# Patient Record
Sex: Female | Born: 1970 | Race: White | Hispanic: No | Marital: Married | Smoking: Never smoker
Health system: Southern US, Community
[De-identification: ages and names within clinical notes are randomized; demographics above are authoritative.]

## PROBLEM LIST (undated history)

## (undated) DIAGNOSIS — Z87442 Personal history of urinary calculi: Secondary | ICD-10-CM

---

## 2013-08-23 ENCOUNTER — Ambulatory Visit (HOSPITAL_COMMUNITY)
Admission: RE | Admit: 2013-08-23 | Discharge: 2013-08-23 | Disposition: A | Discharge status: Home / Self Care | Payer: BC Managed Care – PPO | Source: Ambulatory Visit | Attending: Urology | Admitting: Urology

## 2013-08-23 ENCOUNTER — Other Ambulatory Visit: Payer: Self-pay | Admitting: Urology

## 2013-08-23 ENCOUNTER — Ambulatory Visit (INDEPENDENT_AMBULATORY_CARE_PROVIDER_SITE_OTHER): Payer: BC Managed Care – PPO | Admitting: Urology

## 2013-08-23 DIAGNOSIS — N200 Calculus of kidney: Secondary | ICD-10-CM

## 2013-08-23 DIAGNOSIS — N201 Calculus of ureter: Secondary | ICD-10-CM

## 2013-08-26 ENCOUNTER — Other Ambulatory Visit: Payer: Self-pay | Admitting: Urology

## 2013-08-26 DIAGNOSIS — N200 Calculus of kidney: Secondary | ICD-10-CM

## 2013-08-30 ENCOUNTER — Ambulatory Visit (HOSPITAL_COMMUNITY)
Admission: RE | Admit: 2013-08-30 | Discharge: 2013-08-30 | Disposition: A | Discharge status: Home / Self Care | Payer: BC Managed Care – PPO | Source: Ambulatory Visit | Attending: Urology | Admitting: Urology

## 2013-08-30 DIAGNOSIS — N200 Calculus of kidney: Secondary | ICD-10-CM | POA: Insufficient documentation

## 2013-10-25 ENCOUNTER — Ambulatory Visit (INDEPENDENT_AMBULATORY_CARE_PROVIDER_SITE_OTHER): Payer: BC Managed Care – PPO | Admitting: Urology

## 2013-10-25 ENCOUNTER — Ambulatory Visit (HOSPITAL_COMMUNITY)
Admission: RE | Admit: 2013-10-25 | Discharge: 2013-10-25 | Disposition: A | Discharge status: Home / Self Care | Payer: BC Managed Care – PPO | Source: Ambulatory Visit | Attending: Urology | Admitting: Urology

## 2013-10-25 ENCOUNTER — Other Ambulatory Visit: Payer: Self-pay | Admitting: Urology

## 2013-10-25 DIAGNOSIS — N200 Calculus of kidney: Secondary | ICD-10-CM

## 2017-03-03 ENCOUNTER — Ambulatory Visit: Payer: Self-pay | Admitting: Urology

## 2017-05-12 ENCOUNTER — Other Ambulatory Visit: Payer: Self-pay | Admitting: Urology

## 2017-05-12 ENCOUNTER — Ambulatory Visit (HOSPITAL_COMMUNITY)
Admission: RE | Admit: 2017-05-12 | Discharge: 2017-05-12 | Disposition: A | Discharge status: Home / Self Care | Payer: No Typology Code available for payment source | Source: Ambulatory Visit | Attending: Urology | Admitting: Urology

## 2017-05-12 ENCOUNTER — Ambulatory Visit (INDEPENDENT_AMBULATORY_CARE_PROVIDER_SITE_OTHER): Payer: PRIVATE HEALTH INSURANCE | Admitting: Urology

## 2017-05-12 DIAGNOSIS — N200 Calculus of kidney: Secondary | ICD-10-CM

## 2017-05-12 DIAGNOSIS — K8020 Calculus of gallbladder without cholecystitis without obstruction: Secondary | ICD-10-CM | POA: Diagnosis not present

## 2017-05-12 DIAGNOSIS — I700 Atherosclerosis of aorta: Secondary | ICD-10-CM | POA: Diagnosis not present

## 2017-05-12 DIAGNOSIS — R3121 Asymptomatic microscopic hematuria: Secondary | ICD-10-CM | POA: Diagnosis not present

## 2017-05-12 DIAGNOSIS — N3000 Acute cystitis without hematuria: Secondary | ICD-10-CM | POA: Diagnosis not present

## 2017-08-11 ENCOUNTER — Ambulatory Visit: Payer: No Typology Code available for payment source | Admitting: Urology

## 2018-04-20 ENCOUNTER — Ambulatory Visit (INDEPENDENT_AMBULATORY_CARE_PROVIDER_SITE_OTHER): Payer: No Typology Code available for payment source | Admitting: Urology

## 2018-04-20 ENCOUNTER — Other Ambulatory Visit (HOSPITAL_COMMUNITY): Payer: Self-pay | Admitting: Urology

## 2018-04-20 ENCOUNTER — Ambulatory Visit (HOSPITAL_COMMUNITY)
Admission: RE | Admit: 2018-04-20 | Discharge: 2018-04-20 | Disposition: A | Discharge status: Home / Self Care | Payer: No Typology Code available for payment source | Source: Ambulatory Visit | Attending: Urology | Admitting: Urology

## 2018-04-20 DIAGNOSIS — N200 Calculus of kidney: Secondary | ICD-10-CM

## 2020-02-29 ENCOUNTER — Ambulatory Visit (INDEPENDENT_AMBULATORY_CARE_PROVIDER_SITE_OTHER): Payer: No Typology Code available for payment source | Admitting: Urology

## 2020-02-29 ENCOUNTER — Ambulatory Visit (HOSPITAL_COMMUNITY)
Admission: RE | Admit: 2020-02-29 | Discharge: 2020-02-29 | Disposition: A | Discharge status: Home / Self Care | Payer: No Typology Code available for payment source | Source: Ambulatory Visit | Attending: Urology | Admitting: Urology

## 2020-02-29 ENCOUNTER — Other Ambulatory Visit: Payer: Self-pay

## 2020-02-29 ENCOUNTER — Encounter: Payer: Self-pay | Admitting: Urology

## 2020-02-29 ENCOUNTER — Ambulatory Visit: Payer: No Typology Code available for payment source | Admitting: Urology

## 2020-02-29 VITALS — BP 147/83 | HR 81 | Temp 98.3°F | Ht 66.0 in | Wt 165.0 lb

## 2020-02-29 DIAGNOSIS — R109 Unspecified abdominal pain: Secondary | ICD-10-CM

## 2020-02-29 DIAGNOSIS — N201 Calculus of ureter: Secondary | ICD-10-CM

## 2020-02-29 DIAGNOSIS — N200 Calculus of kidney: Secondary | ICD-10-CM

## 2020-02-29 LAB — MICROSCOPIC EXAMINATION
Epithelial Cells (non renal): NONE SEEN /hpf (ref 0–10)
Renal Epithel, UA: NONE SEEN /hpf

## 2020-02-29 LAB — URINALYSIS, ROUTINE W REFLEX MICROSCOPIC
Bilirubin, UA: NEGATIVE
Glucose, UA: NEGATIVE
Ketones, UA: NEGATIVE
Nitrite, UA: NEGATIVE
Protein,UA: NEGATIVE
Specific Gravity, UA: 1.02 (ref 1.005–1.030)
Urobilinogen, Ur: 0.2 mg/dL (ref 0.2–1.0)
pH, UA: 5.5 (ref 5.0–7.5)

## 2020-02-29 MED ORDER — ONDANSETRON 4 MG PO TBDP: 4.0000 mg | ORAL_TABLET | Freq: Three times a day (TID) | ORAL | 0 refills | Status: DC | PRN

## 2020-02-29 MED ORDER — TAMSULOSIN HCL 0.4 MG PO CAPS: 0.4000 mg | ORAL_CAPSULE | Freq: Every day | ORAL | 0 refills | Status: DC

## 2020-02-29 MED ORDER — OXYCODONE-ACETAMINOPHEN 5-325 MG PO TABS: 1.0000 | ORAL_TABLET | ORAL | 0 refills | Status: DC | PRN

## 2020-02-29 NOTE — H&P (View-Only) (Signed)
 02/29/2020 11:52 AM   Cheryl Jenkins 09/28/1970 [id number]  Referring provider: No referring provider defined for this encounter.  nephrolithiasis  HPI: Cheryl Jenkins is 49yo here for evaluation of left flank pain. She developed left flank pain 1 week ago. The pain is sharp, intermittent mild to moderate and nonradiating. She has had numerous stone events since age 16. No other associated symptoms. No exacerbating/alleviating events. CT shows an 8mm proximal ureteral calculus which can be seen on K[employer] scout film. She has had ESWL in the past for her calculi.    PMH: No past medical history on file.  Surgical History:   Home Medications:  Allergies as of 02/29/2020   Not on File     Medication List    as of February 29, 2020 11:52 AM   You have not been prescribed any medications.     Allergies: Not on File  Family History: No family history on file.  Social History:  has no history on file for tobacco use, alcohol use, and drug use.  ROS: All other review of systems were reviewed and are negative except what is noted above in HPI  Physical Exam: BP (!) 147/83   Pulse 81   Temp 98.3 F (36.8 C)   Ht 5' 6" (1.676 m)   Wt 165 lb (74.8 kg)   LMP 10/18/2013   BMI 26.63 kg/m   Constitutional:  Alert and oriented, No acute distress. HEENT: [place] AT, moist mucus membranes.  Trachea midline, no masses. Cardiovascular: No clubbing, cyanosis, or edema. Respiratory: Normal respiratory effort, no increased work of breathing. GI: Abdomen is soft, nontender, nondistended, no abdominal masses GU: No CVA tenderness.  Lymph: No cervical or inguinal lymphadenopathy. Skin: No rashes, bruises or suspicious lesions. Neurologic: Grossly intact, no focal deficits, moving all 4 extremities. Psychiatric: Normal mood and affect.  Laboratory Data: No results found for: WBC, HGB, HCT, MCV, PLT  No results found for: CREATININE  No results found for: PSA  No results found for:  TESTOSTERONE  No results found for: HGBA1C  Urinalysis No results found for: COLORURINE, APPEARANCEUR, LABSPEC, PHURINE, GLUCOSEU, HGBUR, BILIRUBINUR, KETONESUR, PROTEINUR, UROBILINOGEN, NITRITE, LEUKOCYTESUR  No results found for: LABMICR, WBCUA, RBCUA, LABEPIT, MUCUS, BACTERIA  Pertinent Imaging: CT stone: Images reviewed and discussed with the patient Results for orders placed during the hospital encounter of 04/20/18  DG Abd 1 View  Narrative CLINICAL DATA:  Bilateral back pain.  History of kidney stones.  EXAM: ABDOMEN - 1 VIEW  COMPARISON:  CT scan 05/12/2017  FINDINGS: Bilateral upper pole/midpole renal calculi are noted. No definite ureteral or bladder calculi. The soft tissue shadows of the abdomen are maintained. The bowel gas pattern is unremarkable. The bony structures are intact.  IMPRESSION: Bilateral renal calculi but no definite ureteral or bladder calculi.   Electronically Signed By: P.  Gallerani M.D. On: 04/20/2018 14:55  No results found for this or any previous visit.  No results found for this or any previous visit.  No results found for this or any previous visit.  No results found for this or any previous visit.  No results found for this or any previous visit.  No results found for this or any previous visit.  Results for orders placed during the hospital encounter of 05/12/17  CT RENAL STONE STUDY  Narrative CLINICAL DATA:  Left flank pain for 6 months with microhematuria. History of kidney stones.  EXAM: CT ABDOMEN AND PELVIS WITHOUT CONTRAST  TECHNIQUE: Multidetector CT imaging of   the abdomen and pelvis was performed following the standard protocol without IV contrast.  COMPARISON:  CT 08/30/2013.  FINDINGS: Lower chest: Clear lung bases. No significant pleural or pericardial effusion. Mild coronary artery atherosclerosis.  Hepatobiliary: The liver appears unremarkable as imaged in the noncontrast state. There is a  peripherally calcified 15 mm gallstone. The gallbladder is incompletely distended. No gallbladder wall thickening, surrounding inflammation or biliary dilatation.  Pancreas: Unremarkable. No pancreatic ductal dilatation or surrounding inflammatory changes.  Spleen: Normal in size without focal abnormality.  Adrenals/Urinary Tract: Both adrenal glands appear normal. There is a single 4 mm calculus in the interpolar region of the right kidney. There are 2 calculi in the interpolar region of the left kidney, largest measuring 8 mm on image 34/2. No evidence of ureteral or bladder calculus. There is no evidence of renal mass, hydronephrosis or perinephric soft tissue stranding. The bladder appears unremarkable.  Stomach/Bowel: No evidence of bowel wall thickening, distention or surrounding inflammatory change. The appendix appears normal. There is mild colonic diverticulosis.  Vascular/Lymphatic: There are no enlarged abdominal or pelvic lymph nodes. Mild aortoiliac atherosclerosis. No acute vascular findings on noncontrast imaging.  Reproductive: 2.9 cm left ovarian follicle. The uterus and right ovary appear unremarkable. No pelvic inflammatory changes.  Other: Small umbilical hernia containing only fat.  No ascites.  Musculoskeletal: No acute or significant osseous findings.  IMPRESSION: 1. Bilateral nephrolithiasis. No evidence hydronephrosis or ureteral calculus. 2. Cholelithiasis without evidence of cholecystitis or biliary dilatation. 3. Mild aortic atherosclerosis (ICD10-I70.0).   Electronically Signed By: Carey Bullocks M.D. On: 05/12/2017 13:02   Assessment & Plan:    1. Left ureteral calculus -We discussed the management of kidney stones. These options include observation, ureteroscopy, shockwave lithotripsy (ESWL) and percutaneous nephrolithotomy (PCNL). We discussed which options are relevant to the patient's stone(s). We discussed the natural history of  kidney stones as well as the complications of untreated stones and the impact on quality of life without treatment as well as with each of the above listed treatments. We also discussed the efficacy of each treatment in its ability to clear the stone burden. With any of these management options I discussed the signs and symptoms of infection and the need for emergent treatment should these be experienced. For each option we discussed the ability of each procedure to clear the patient of their stone burden.   For observation I described the risks which include but are not limited to silent renal damage, life-threatening infection, need for emergent surgery, failure to pass stone and pain.   For ureteroscopy I described the risks which include bleeding, infection, damage to contiguous structures, positioning injury, ureteral stricture, ureteral avulsion, ureteral injury, need for prolonged ureteral stent, inability to perform ureteroscopy, need for an interval procedure, inability to clear stone burden, stent discomfort/pain, heart attack, stroke, pulmonary embolus and the inherent risks with general anesthesia.   For shockwave lithotripsy I described the risks which include arrhythmia, kidney contusion, kidney hemorrhage, need for transfusion, pain, inability to adequately break up stone, inability to pass stone fragments, Steinstrasse, infection associated with obstructing stones, need for alternate surgical procedure, need for repeat shockwave lithotripsy, MI, CVA, PE and the inherent risks with anesthesia/conscious sedation.   For PCNL I described the risks including positioning injury, pneumothorax, hydrothorax, need for chest tube, inability to clear stone burden, renal laceration, arterial venous fistula or malformation, need for embolization of kidney, loss of kidney or renal function, need for repeat procedure, need for prolonged nephrostomy tube,  ureteral avulsion, MI, CVA, PE and the inherent risks  of general anesthesia.   - The patient would like to proceed with Left ESWL    No follow-ups on file.  Wilkie Aye, MD  [facility] [place]

## 2020-02-29 NOTE — Patient Instructions (Signed)

## 2020-02-29 NOTE — Progress Notes (Signed)
02/29/2020 11:52 AM   [name] [birth year] [id number]  Referring provider: No referring provider defined for this encounter.  nephrolithiasis  HPI: Ms Stigler is 50yo here for evaluation of left flank pain. She developed left flank pain 1 week ago. The pain is sharp, intermittent mild to moderate and nonradiating. She has had numerous stone events since age 50. No other associated symptoms. No exacerbating/alleviating events. CT shows an 32mm proximal ureteral calculus which can be seen on [employer] scout film. She has had ESWL in the past for her calculi.    PMH: No past medical history on file.  Surgical History:   Home Medications:  Allergies as of 02/29/2020   Not on File     Medication List    as of February 29, 2020 11:52 AM   You have not been prescribed any medications.     Allergies: Not on File  Family History: No family history on file.  Social History:  has no history on file for tobacco use, alcohol use, and drug use.  ROS: All other review of systems were reviewed and are negative except what is noted above in HPI  Physical Exam: BP (!) 147/83   Pulse 81   Temp 98.3 F (36.8 C)   Ht 5\' 6"  (1.676 m)   Wt 165 lb (74.8 kg)   LMP 10/18/2013   BMI 26.63 kg/m   Constitutional:  Alert and oriented, No acute distress. HEENT: [place] AT, moist mucus membranes.  Trachea midline, no masses. Cardiovascular: No clubbing, cyanosis, or edema. Respiratory: Normal respiratory effort, no increased work of breathing. GI: Abdomen is soft, nontender, nondistended, no abdominal masses GU: No CVA tenderness.  Lymph: No cervical or inguinal lymphadenopathy. Skin: No rashes, bruises or suspicious lesions. Neurologic: Grossly intact, no focal deficits, moving all 4 extremities. Psychiatric: Normal mood and affect.  Laboratory Data: No results found for: WBC, HGB, HCT, MCV, PLT  No results found for: CREATININE  No results found for: PSA  No results found for:  TESTOSTERONE  No results found for: HGBA1C  Urinalysis No results found for: COLORURINE, APPEARANCEUR, LABSPEC, PHURINE, GLUCOSEU, HGBUR, BILIRUBINUR, KETONESUR, PROTEINUR, UROBILINOGEN, NITRITE, LEUKOCYTESUR  No results found for: LABMICR, WBCUA, RBCUA, LABEPIT, MUCUS, BACTERIA  Pertinent Imaging: CT stone: Images reviewed and discussed with the patient Results for orders placed during the hospital encounter of 04/20/18  DG Abd 1 View  Narrative CLINICAL DATA:  Bilateral back pain.  History of kidney stones.  EXAM: ABDOMEN - 1 VIEW  COMPARISON:  CT scan 05/12/2017  FINDINGS: Bilateral upper pole/midpole renal calculi are noted. No definite ureteral or bladder calculi. The soft tissue shadows of the abdomen are maintained. The bowel gas pattern is unremarkable. The bony structures are intact.  IMPRESSION: Bilateral renal calculi but no definite ureteral or bladder calculi.   Electronically Signed By: 07/12/2017 M.D. On: 04/20/2018 14:55  No results found for this or any previous visit.  No results found for this or any previous visit.  No results found for this or any previous visit.  No results found for this or any previous visit.  No results found for this or any previous visit.  No results found for this or any previous visit.  Results for orders placed during the hospital encounter of 05/12/17  CT RENAL STONE STUDY  Narrative CLINICAL DATA:  Left flank pain for 6 months with microhematuria. History of kidney stones.  EXAM: CT ABDOMEN AND PELVIS WITHOUT CONTRAST  TECHNIQUE: Multidetector CT imaging of  the abdomen and pelvis was performed following the standard protocol without IV contrast.  COMPARISON:  CT 08/30/2013.  FINDINGS: Lower chest: Clear lung bases. No significant pleural or pericardial effusion. Mild coronary artery atherosclerosis.  Hepatobiliary: The liver appears unremarkable as imaged in the noncontrast state. There is a  peripherally calcified 15 mm gallstone. The gallbladder is incompletely distended. No gallbladder wall thickening, surrounding inflammation or biliary dilatation.  Pancreas: Unremarkable. No pancreatic ductal dilatation or surrounding inflammatory changes.  Spleen: Normal in size without focal abnormality.  Adrenals/Urinary Tract: Both adrenal glands appear normal. There is a single 4 mm calculus in the interpolar region of the right kidney. There are 2 calculi in the interpolar region of the left kidney, largest measuring 8 mm on image 34/2. No evidence of ureteral or bladder calculus. There is no evidence of renal mass, hydronephrosis or perinephric soft tissue stranding. The bladder appears unremarkable.  Stomach/Bowel: No evidence of bowel wall thickening, distention or surrounding inflammatory change. The appendix appears normal. There is mild colonic diverticulosis.  Vascular/Lymphatic: There are no enlarged abdominal or pelvic lymph nodes. Mild aortoiliac atherosclerosis. No acute vascular findings on noncontrast imaging.  Reproductive: 2.9 cm left ovarian follicle. The uterus and right ovary appear unremarkable. No pelvic inflammatory changes.  Other: Small umbilical hernia containing only fat.  No ascites.  Musculoskeletal: No acute or significant osseous findings.  IMPRESSION: 1. Bilateral nephrolithiasis. No evidence hydronephrosis or ureteral calculus. 2. Cholelithiasis without evidence of cholecystitis or biliary dilatation. 3. Mild aortic atherosclerosis (ICD10-I70.0).   Electronically Signed By: Carey Bullocks M.D. On: 05/12/2017 13:02   Assessment & Plan:    1. Left ureteral calculus -We discussed the management of kidney stones. These options include observation, ureteroscopy, shockwave lithotripsy (ESWL) and percutaneous nephrolithotomy (PCNL). We discussed which options are relevant to the patient's stone(s). We discussed the natural history of  kidney stones as well as the complications of untreated stones and the impact on quality of life without treatment as well as with each of the above listed treatments. We also discussed the efficacy of each treatment in its ability to clear the stone burden. With any of these management options I discussed the signs and symptoms of infection and the need for emergent treatment should these be experienced. For each option we discussed the ability of each procedure to clear the patient of their stone burden.   For observation I described the risks which include but are not limited to silent renal damage, life-threatening infection, need for emergent surgery, failure to pass stone and pain.   For ureteroscopy I described the risks which include bleeding, infection, damage to contiguous structures, positioning injury, ureteral stricture, ureteral avulsion, ureteral injury, need for prolonged ureteral stent, inability to perform ureteroscopy, need for an interval procedure, inability to clear stone burden, stent discomfort/pain, heart attack, stroke, pulmonary embolus and the inherent risks with general anesthesia.   For shockwave lithotripsy I described the risks which include arrhythmia, kidney contusion, kidney hemorrhage, need for transfusion, pain, inability to adequately break up stone, inability to pass stone fragments, Steinstrasse, infection associated with obstructing stones, need for alternate surgical procedure, need for repeat shockwave lithotripsy, MI, CVA, PE and the inherent risks with anesthesia/conscious sedation.   For PCNL I described the risks including positioning injury, pneumothorax, hydrothorax, need for chest tube, inability to clear stone burden, renal laceration, arterial venous fistula or malformation, need for embolization of kidney, loss of kidney or renal function, need for repeat procedure, need for prolonged nephrostomy tube,  ureteral avulsion, MI, CVA, PE and the inherent risks  of general anesthesia.   - The patient would like to proceed with Left ESWL    No follow-ups on file.  Wilkie Aye, MD  [facility] [place]

## 2020-02-29 NOTE — Progress Notes (Signed)
Urological Symptom Review  Patient is experiencing the following symptoms: Frequent urination Hard to postpone urination Get up at night to urinate Blood in urine  Kidney stone   Review of Systems  Gastrointestinal (upper)  : Nausea Vomiting  Gastrointestinal (lower) : Constipation  Constitutional : Fatigue  Skin: Negative for skin symptoms  Eyes: Negative for eye symptoms  Ear/Nose/Throat : Negative for Ear/Nose/Throat symptoms  Hematologic/Lymphatic: Negative for Hematologic/Lymphatic symptoms  Cardiovascular : Negative for cardiovascular symptoms  Respiratory : Negative for respiratory symptoms  Endocrine: Negative for endocrine symptoms  Musculoskeletal: Back pain  Neurological: Negative for neurological symptoms  Psychologic: Negative for psychiatric symptoms

## 2020-03-01 ENCOUNTER — Other Ambulatory Visit: Payer: Self-pay

## 2020-03-01 ENCOUNTER — Encounter (HOSPITAL_COMMUNITY)
Admission: RE | Admit: 2020-03-01 | Discharge: 2020-03-01 | Disposition: A | Discharge status: Home / Self Care | Payer: No Typology Code available for payment source | Source: Ambulatory Visit | Attending: Urology | Admitting: Urology

## 2020-03-01 ENCOUNTER — Encounter (HOSPITAL_COMMUNITY): Payer: Self-pay

## 2020-03-01 HISTORY — DX: Personal history of urinary calculi: Z87.442

## 2020-03-05 ENCOUNTER — Other Ambulatory Visit (HOSPITAL_COMMUNITY)
Admission: RE | Admit: 2020-03-05 | Discharge: 2020-03-05 | Disposition: A | Discharge status: Home / Self Care | Payer: No Typology Code available for payment source | Source: Ambulatory Visit | Attending: Urology | Admitting: Urology

## 2020-03-05 ENCOUNTER — Other Ambulatory Visit: Payer: Self-pay

## 2020-03-05 DIAGNOSIS — Z20822 Contact with and (suspected) exposure to covid-19: Secondary | ICD-10-CM | POA: Insufficient documentation

## 2020-03-05 DIAGNOSIS — Z01812 Encounter for preprocedural laboratory examination: Secondary | ICD-10-CM | POA: Insufficient documentation

## 2020-03-05 LAB — SARS CORONAVIRUS 2 (TAT 6-24 HRS): SARS Coronavirus 2: NEGATIVE

## 2020-03-06 ENCOUNTER — Ambulatory Visit (HOSPITAL_COMMUNITY)
Admission: RE | Admit: 2020-03-06 | Discharge: 2020-03-06 | Disposition: A | Discharge status: Home / Self Care | Payer: No Typology Code available for payment source | Attending: Urology | Admitting: Urology

## 2020-03-06 ENCOUNTER — Encounter (HOSPITAL_COMMUNITY)
Admission: RE | Disposition: A | Discharge status: Home / Self Care | Payer: Self-pay | Source: Home / Self Care | Attending: Urology

## 2020-03-06 ENCOUNTER — Ambulatory Visit (HOSPITAL_COMMUNITY)
Admission: RE | Admit: 2020-03-06 | Discharge: 2020-03-06 | Disposition: A | Discharge status: Home / Self Care | Payer: No Typology Code available for payment source | Source: Home / Self Care | Attending: Urology | Admitting: Urology

## 2020-03-06 DIAGNOSIS — N200 Calculus of kidney: Secondary | ICD-10-CM

## 2020-03-06 DIAGNOSIS — N202 Calculus of kidney with calculus of ureter: Secondary | ICD-10-CM | POA: Diagnosis not present

## 2020-03-06 DIAGNOSIS — N201 Calculus of ureter: Secondary | ICD-10-CM

## 2020-03-06 HISTORY — PX: EXTRACORPOREAL SHOCK WAVE LITHOTRIPSY: SHX1557

## 2020-03-06 SURGERY — LITHOTRIPSY, ESWL
Anesthesia: LOCAL | Laterality: Left

## 2020-03-06 MED ORDER — DIAZEPAM 5 MG PO TABS
10.0000 mg | ORAL_TABLET | Freq: Once | ORAL | Status: AC
  Administered 2020-03-06: 10 mg via ORAL
  Filled 2020-03-06: qty 2

## 2020-03-06 MED ORDER — TAMSULOSIN HCL 0.4 MG PO CAPS: 0.4000 mg | ORAL_CAPSULE | Freq: Every day | ORAL | 0 refills | Status: DC

## 2020-03-06 MED ORDER — ONDANSETRON 4 MG PO TBDP: 4.0000 mg | ORAL_TABLET | Freq: Three times a day (TID) | ORAL | 0 refills | Status: DC | PRN

## 2020-03-06 MED ORDER — DIPHENHYDRAMINE HCL 25 MG PO CAPS
25.0000 mg | ORAL_CAPSULE | ORAL | Status: AC
  Administered 2020-03-06: 25 mg via ORAL
  Filled 2020-03-06: qty 1

## 2020-03-06 MED ORDER — OXYCODONE-ACETAMINOPHEN 5-325 MG PO TABS: 1.0000 | ORAL_TABLET | ORAL | 0 refills | Status: DC | PRN

## 2020-03-06 MED ORDER — SODIUM CHLORIDE 0.9 % IV SOLN: Freq: Once | INTRAVENOUS | Status: AC

## 2020-03-06 NOTE — Interval H&P Note (Signed)
History and Physical Interval Note:  03/06/2020 10:51 AM  Cheryl Jenkins  has presented today for surgery, with the diagnosis of left ureteral calculus.  The various methods of treatment have been discussed with the patient and family. After consideration of risks, benefits and other options for treatment, the patient has consented to  Procedure(s): EXTRACORPOREAL SHOCK WAVE LITHOTRIPSY (ESWL) (Left) as a surgical intervention.  The patient's history has been reviewed, patient examined, no change in status, stable for surgery.  I have reviewed the patient's chart and labs.  Questions were answered to the patient's satisfaction.     Wilkie Aye

## 2020-03-06 NOTE — Discharge Instructions (Signed)
Lithotripsy, Care After This sheet gives you information about how to care for yourself after your procedure. Your health care provider may also give you more specific instructions. If you have problems or questions, contact your health care provider. What can I expect after the procedure? After the procedure, it is common to have:  Some blood in your urine. This should only last for a few days.  Soreness in your back, sides, or upper abdomen for a few days.  Blotches or bruises on the area where the shock wave entered the skin.  Pain, discomfort, or nausea when pieces (fragments) of the kidney stone move through the tube that carries urine from the kidney to the bladder (ureter). Stone fragments may pass soon after the procedure, but they may continue to pass for up to 4-8 weeks. ? If you have severe pain or nausea, contact your health care provider. This may be caused by a large stone that was not broken up, and this may mean that you need more treatment.  Some pain or discomfort during urination.  Some pain or discomfort in the lower abdomen or (in men) at the base of the penis. Follow these instructions at home: Medicines  Take over-the-counter and prescription medicines only as told by your health care provider.  If you were prescribed an antibiotic medicine, take it as told by your health care provider. Do not stop taking the antibiotic even if you start to feel better.  Ask your health care provider if the medicine prescribed to you requires you to avoid driving or using machinery. Eating and drinking  Drink enough fluid to keep your urine pale yellow. This helps any remaining pieces of the stone to pass. It can also help prevent new stones from forming.  Eat plenty of fresh fruits and vegetables.  Follow instructions from your health care provider about eating or drinking restrictions. You may be instructed to: ? Reduce how much salt (sodium) you eat or drink. Check  ingredients and nutrition facts on packaged foods and beverages to see how much sodium they contain. ? Reduce how much meat you eat.  Eat the recommended amount of calcium for your age and gender. Ask your health care provider how much calcium you should have.      General instructions  Get plenty of rest.  Return to your normal activities as told by your health care provider. Ask your health care provider what activities are safe for you. Most people can resume normal activities 1-2 days after the procedure.  If you were given a sedative during the procedure, it can affect you for several hours. Do not drive or operate machinery until your health care provider says that it is safe.  Your health care provider may direct you to lie in a certain position (postural drainage) and tap firmly (percuss) over your kidney area to help stone fragments pass. Follow instructions as told by your health care provider.  If directed, strain all urine through the strainer that was provided by your health care provider. ? Keep all fragments for your health care provider to see. Any stones that are found may be sent to a medical lab for examination. The stone may be as small as a grain of salt.  Keep all follow-up visits as told by your health care provider. This is important. Contact a health care provider if:  You have a fever or chills.  You have nausea that is severe or does not go away.  You have   any of these urinary symptoms: ? Blood in your urine for longer than your health care provider told you to expect. ? Urine that smells bad or unusual. ? Feeling a strong urge to urinate after emptying your bladder. ? Pain or burning with urination that does not go away. ? Urinating more often than usual and this does not go away.  You have a stent and it comes out. Get help right away if:  You have severe pain in your back, sides, or upper abdomen.  You have any of these urinary symptoms: ? Severe  pain while urinating. ? More blood in your urine or having blood in your urine when you did not before. ? Passing blood clots in your urine. ? Passing only a small amount of urine or being unable to pass any urine at all.  You have severe nausea that leads to persistent vomiting.  You faint. Summary  After this procedure, it is common to have some pain, discomfort, or nausea when pieces (fragments) of the kidney stone move through the tube that carries urine from the kidney to the bladder (ureter). If this pain or nausea is severe, however, you should contact your health care provider.  Return to your normal activities as told by your health care provider. Ask your health care provider what activities are safe for you.  Drink enough fluid to keep your urine pale yellow. This helps any remaining pieces of the stone to pass, and it can help prevent new stones from forming.  If directed, strain your urine and keep all fragments for your health care provider to see. Fragments or stones may be as small as a grain of salt.  Get help right away if you have severe pain in your back, sides, or upper abdomen, or if you have severe pain while urinating. This information is not intended to replace advice given to you by your health care provider. Make sure you discuss any questions you have with your health care provider. Document Revised: 11/10/2018 Document Reviewed: 11/10/2018 Elsevier Patient Education  2021 [employer]   PATIENT INSTRUCTIONS POST-ANESTHESIA  IMMEDIATELY FOLLOWING SURGERY:  Do not drive or operate machinery for the first twenty four hours after surgery.  Do not make any important decisions for twenty four hours after surgery or while taking narcotic pain medications or sedatives.  If you develop intractable nausea and vomiting or a severe headache please notify your doctor immediately.  FOLLOW-UP:  Please make an appointment with your surgeon as instructed. You do not need to  follow up with anesthesia unless specifically instructed to do so.  WOUND CARE INSTRUCTIONS (if applicable):  Keep a dry clean dressing on the anesthesia/puncture wound site if there is drainage.  Once the wound has quit draining you may leave it open to air.  Generally you should leave the bandage intact for twenty four hours unless there is drainage.  If the epidural site drains for more than 36-48 hours please call the anesthesia department.  QUESTIONS?:  Please feel free to call your physician or the hospital operator if you have any questions, and they will be happy to assist you.       

## 2020-03-08 ENCOUNTER — Encounter (HOSPITAL_COMMUNITY): Payer: Self-pay | Admitting: Urology

## 2020-03-20 ENCOUNTER — Other Ambulatory Visit: Payer: Self-pay

## 2020-03-20 ENCOUNTER — Ambulatory Visit (HOSPITAL_COMMUNITY)
Admission: RE | Admit: 2020-03-20 | Discharge: 2020-03-20 | Disposition: A | Discharge status: Home / Self Care | Payer: No Typology Code available for payment source | Source: Ambulatory Visit | Attending: Urology | Admitting: Urology

## 2020-03-20 ENCOUNTER — Ambulatory Visit (INDEPENDENT_AMBULATORY_CARE_PROVIDER_SITE_OTHER): Payer: No Typology Code available for payment source | Admitting: Urology

## 2020-03-20 ENCOUNTER — Encounter: Payer: Self-pay | Admitting: Urology

## 2020-03-20 VITALS — BP 126/76 | HR 82 | Temp 98.6°F | Ht 66.0 in | Wt 165.0 lb

## 2020-03-20 DIAGNOSIS — N201 Calculus of ureter: Secondary | ICD-10-CM | POA: Insufficient documentation

## 2020-03-20 LAB — URINALYSIS, ROUTINE W REFLEX MICROSCOPIC
Bilirubin, UA: NEGATIVE
Glucose, UA: NEGATIVE
Ketones, UA: NEGATIVE
Leukocytes,UA: NEGATIVE
Nitrite, UA: NEGATIVE
Protein,UA: NEGATIVE
Specific Gravity, UA: <1.005 — ABNORMAL LOW (ref 1.005–1.030)
Urobilinogen, Ur: 0.2 mg/dL (ref 0.2–1.0)
pH, UA: 5.5 (ref 5.0–7.5)

## 2020-03-20 LAB — MICROSCOPIC EXAMINATION
Epithelial Cells (non renal): >10 /hpf — AB (ref 0–10)
Renal Epithel, UA: NONE SEEN /hpf

## 2020-03-20 MED ORDER — TAMSULOSIN HCL 0.4 MG PO CAPS: 0.4000 mg | ORAL_CAPSULE | Freq: Every day | ORAL | 11 refills | Status: DC

## 2020-03-20 NOTE — Patient Instructions (Signed)

## 2020-03-20 NOTE — Progress Notes (Signed)
03/20/2020 2:30 PM   Cheryl Jenkins [birth year] [id number]  Referring provider: No referring provider defined for this encounter.  nephrolithiasis  HPI: Cheryl Jenkins is a 50yo here for followup for nephrolithiasis. She underwent Left ESWL 2 weeks ago and passed numerous fragments. She has not passed a fragment in 4 days. KUB shows possible tiny left lower pole calculi. No flank pain   PMH: Past Medical History:  Diagnosis Date  . History of kidney stones     Surgical History: Past Surgical History:  Procedure Laterality Date  . ABDOMINAL HYSTERECTOMY    . EXTRACORPOREAL SHOCK WAVE LITHOTRIPSY Left 03/06/2020   Procedure: EXTRACORPOREAL SHOCK WAVE LITHOTRIPSY (ESWL);  Surgeon: Malen Gauze, MD;  Location: [facility];  Service: Urology;  Laterality: Left;  . TONSILLECTOMY      Home Medications:  Allergies as of 03/20/2020   No Known Allergies     Medication List       Accurate as of March 20, 2020  2:30 PM. If you have any questions, ask your nurse or doctor.        aspirin EC 81 MG tablet Take 81 mg by mouth daily. Swallow whole.   multivitamin with minerals Tabs tablet Take 1 tablet by mouth daily.   ondansetron 4 MG disintegrating tablet Commonly known as: Zofran ODT Take 1 tablet (4 mg total) by mouth every 8 (eight) hours as needed for nausea or vomiting.   oxyCODONE-acetaminophen 5-325 MG tablet Commonly known as: Percocet Take 1 tablet by mouth every 4 (four) hours as needed for moderate pain.   tamsulosin 0.4 MG Caps capsule Commonly known as: FLOMAX Take 1 capsule (0.4 mg total) by mouth daily.       Allergies: No Known Allergies  Family History: No family history on file.  Social History:  reports that she has never smoked. She has never used smokeless tobacco. She reports that she does not use drugs. No history on file for alcohol use.  ROS: All other review of systems were reviewed and are negative except what is noted above in  HPI  Physical Exam: BP 126/76   Pulse 82   Temp 98.6 F (37 C)   Ht 5\' 6"  (1.676 m)   Wt 165 lb (74.8 kg)   LMP 10/18/2013   BMI 26.63 kg/m   Constitutional:  Alert and oriented, No acute distress. HEENT: [place] AT, moist mucus membranes.  Trachea midline, no masses. Cardiovascular: No clubbing, cyanosis, or edema. Respiratory: Normal respiratory effort, no increased work of breathing. GI: Abdomen is soft, nontender, nondistended, no abdominal masses GU: No CVA tenderness.  Lymph: No cervical or inguinal lymphadenopathy. Skin: No rashes, bruises or suspicious lesions. Neurologic: Grossly intact, no focal deficits, moving all 4 extremities. Psychiatric: Normal mood and affect.  Laboratory Data: No results found for: WBC, HGB, HCT, MCV, PLT  No results found for: CREATININE  No results found for: PSA  No results found for: TESTOSTERONE  No results found for: HGBA1C  Urinalysis    Component Value Date/Time   APPEARANCEUR Clear 02/29/2020 1431   GLUCOSEU Negative 02/29/2020 1431   BILIRUBINUR Negative 02/29/2020 1431   PROTEINUR Negative 02/29/2020 1431   NITRITE Negative 02/29/2020 1431   LEUKOCYTESUR 1+ (A) 02/29/2020 1431    Lab Results  Component Value Date   LABMICR See below: 02/29/2020   WBCUA 6-10 (A) 02/29/2020   LABEPIT None seen 02/29/2020   BACTERIA Moderate (A) 02/29/2020    Pertinent Imaging: KUB today: Images reviewed and disucssed  with the patient Results for orders placed during the hospital encounter of 03/06/20  DG Abd 1 View  Narrative CLINICAL DATA:  Pre lithotripsy this morning. Left flank pain with known renal stone.  EXAM: ABDOMEN - 1 VIEW  COMPARISON:  04/20/2018 plain film.  02/29/2020 CT.  FINDINGS: 1.0 cm left proximal ureteric stone projects between the left transverse processes of L2 and L3.  The tiny renal calculi on the prior CT are not readily apparent, given large volume overlying stool. A right pelvic  phlebolith, without distal ureteric stones. Non-obstructive bowel gas pattern.  IMPRESSION: Left proximal ureteric calculus, as before.   Electronically Signed By: Jeronimo Greaves M.D. On: 03/06/2020 15:27  No results found for this or any previous visit.  No results found for this or any previous visit.  No results found for this or any previous visit.  No results found for this or any previous visit.  No results found for this or any previous visit.  No results found for this or any previous visit.  Results for orders placed in visit on 02/29/20  CT RENAL STONE STUDY  Narrative CLINICAL DATA:  Left flank pain for 1 week. Hematuria.  EXAM: CT ABDOMEN AND PELVIS WITHOUT CONTRAST  TECHNIQUE: Multidetector CT imaging of the abdomen and pelvis was performed following the standard protocol without IV contrast.  COMPARISON:  Multiple exams, including 05/12/2017 CT scan  FINDINGS: Lower chest: Left anterior descending coronary artery atherosclerotic calcification.  Triangular subpleural density in the left lower lobe probably from atelectasis, measuring about 0.6 cm in thickness.  Hepatobiliary: 1.4 cm rim calcified gallstone in the gallbladder. Otherwise unremarkable.  Pancreas: Unremarkable  Spleen: Unremarkable  Adrenals/Urinary Tract: Two nonobstructive right renal calculi are present, the larger measuring 0.2 cm in diameter in the mid kidney.  Mild to moderate left hydronephrosis due to an obstructive left UPJ stone measuring 0.8 cm in long axis on image 42 of series 2. The ureter distal to this stone is of normal caliber. Two additional nonobstructive left renal calculi are present. No bladder calculi noted.  The adrenal glands appear normal.  Stomach/Bowel: The stomach is moderately distended with food material.  Scattered diverticula of the distal descending colon and proximal sigmoid colon. Borderline prominence of stool in the proximal  colon.  Vascular/Lymphatic: Aortoiliac atherosclerotic vascular disease.  Reproductive: Uterus absent. Adnexa unremarkable.  Other: No supplemental non-categorized findings.  Musculoskeletal: Unremarkable  IMPRESSION: 1. Mild to moderate left hydronephrosis due to an obstructive 0.8 cm left UPJ stone. 2. Bilateral nonobstructive nephrolithiasis. 3. Other imaging findings of potential clinical significance: Coronary atherosclerosis. Cholelithiasis. Scattered diverticula of the distal descending colon and proximal sigmoid colon. Borderline prominence of stool in the proximal colon. 4. Aortic atherosclerosis.  Aortic Atherosclerosis (ICD10-I70.0).   Electronically Signed By: Gaylyn Rong M.D. On: 02/29/2020 14:10   Assessment & Plan:    1. Ureteral calculi -continue medical expulsive therapy. RTC 1 month - Urinalysis, Routine w reflex microscopic   No follow-ups on file.  Wilkie Aye, MD  [facility] [place]

## 2020-03-20 NOTE — Progress Notes (Signed)
Urological Symptom Review ° °Patient is experiencing the following symptoms: °kidney stones ° ° °Review of Systems ° °Gastrointestinal (upper)  : °Negative for upper GI symptoms ° °Gastrointestinal (lower) : °Negative for lower GI symptoms ° °Constitutional : °Negative for symptoms ° °Skin: °Negative for skin symptoms ° °Eyes: °Negative for eye symptoms ° °Ear/Nose/Throat : °Negative for Ear/Nose/Throat symptoms ° °Hematologic/Lymphatic: °Negative for Hematologic/Lymphatic symptoms ° °Cardiovascular : °Negative for cardiovascular symptoms ° °Respiratory : °Negative for respiratory symptoms ° °Endocrine: °Negative for endocrine symptoms ° °Musculoskeletal: °Negative for musculoskeletal symptoms ° °Neurological: °Negative for neurological symptoms ° °Psychologic: °Negative for psychiatric symptoms ° °

## 2020-03-24 LAB — CALCULI, WITH PHOTOGRAPH (CLINICAL LAB)
Calcium Oxalate Dihydrate: 20 %
Calcium Oxalate Monohydrate: 80 %
Weight Calculi: 214 mg

## 2020-05-01 ENCOUNTER — Other Ambulatory Visit: Payer: Self-pay

## 2020-05-01 ENCOUNTER — Encounter: Payer: Self-pay | Admitting: Urology

## 2020-05-01 ENCOUNTER — Ambulatory Visit (INDEPENDENT_AMBULATORY_CARE_PROVIDER_SITE_OTHER): Payer: No Typology Code available for payment source | Admitting: Urology

## 2020-05-01 ENCOUNTER — Ambulatory Visit (HOSPITAL_COMMUNITY)
Admission: RE | Admit: 2020-05-01 | Discharge: 2020-05-01 | Disposition: A | Discharge status: Home / Self Care | Payer: No Typology Code available for payment source | Source: Ambulatory Visit | Attending: Urology | Admitting: Urology

## 2020-05-01 VITALS — BP 125/76 | HR 99 | Temp 98.5°F | Ht 66.0 in | Wt 165.0 lb

## 2020-05-01 DIAGNOSIS — R3912 Poor urinary stream: Secondary | ICD-10-CM | POA: Diagnosis not present

## 2020-05-01 DIAGNOSIS — N201 Calculus of ureter: Secondary | ICD-10-CM | POA: Insufficient documentation

## 2020-05-01 LAB — URINALYSIS, ROUTINE W REFLEX MICROSCOPIC
Bilirubin, UA: NEGATIVE
Glucose, UA: NEGATIVE
Nitrite, UA: NEGATIVE
Protein,UA: NEGATIVE
RBC, UA: NEGATIVE
Specific Gravity, UA: 1.025 (ref 1.005–1.030)
Urobilinogen, Ur: 0.2 mg/dL (ref 0.2–1.0)
pH, UA: 5.5 (ref 5.0–7.5)

## 2020-05-01 LAB — MICROSCOPIC EXAMINATION
RBC, Urine: NONE SEEN /hpf (ref 0–2)
Renal Epithel, UA: NONE SEEN /hpf

## 2020-05-01 MED ORDER — TAMSULOSIN HCL 0.4 MG PO CAPS: 0.4000 mg | ORAL_CAPSULE | Freq: Every day | ORAL | 11 refills | Status: DC

## 2020-05-01 NOTE — Patient Instructions (Signed)

## 2020-05-01 NOTE — Progress Notes (Signed)
05/01/2020 2:58 PM   Cheryl Jenkins October 10, 1970 [id number]  Referring provider: No referring provider defined for this encounter.  Followup nephrolithiasis  HPI: [name] is a 49yo here for followup for nephrolithiasis. No stone events since last visit. KUB shows no left renal calculi and a vry small right renal calculus. She denies any LUTS.    PMH: Past Medical History:  Diagnosis Date  . History of kidney stones     Surgical History: Past Surgical History:  Procedure Laterality Date  . ABDOMINAL HYSTERECTOMY    . EXTRACORPOREAL SHOCK WAVE LITHOTRIPSY Left 03/06/2020   Procedure: EXTRACORPOREAL SHOCK WAVE LITHOTRIPSY (ESWL);  Surgeon: Cheryl Gauze, MD;  Location: [facility];  Service: Urology;  Laterality: Left;  . TONSILLECTOMY      Home Medications:  Allergies as of 05/01/2020   No Known Allergies     Medication List       Accurate as of May 01, 2020  2:58 PM. If you have any questions, ask your nurse or doctor.        aspirin EC 81 MG tablet Take 81 mg by mouth daily. Swallow whole.   multivitamin with minerals Tabs tablet Take 1 tablet by mouth daily.   ondansetron 4 MG disintegrating tablet Commonly known as: Zofran ODT Take 1 tablet (4 mg total) by mouth every 8 (eight) hours as needed for nausea or vomiting.   oxyCODONE-acetaminophen 5-325 MG tablet Commonly known as: Percocet Take 1 tablet by mouth every 4 (four) hours as needed for moderate pain.   tamsulosin 0.4 MG Caps capsule Commonly known as: FLOMAX Take 1 capsule (0.4 mg total) by mouth daily.       Allergies: No Known Allergies  Family History: History reviewed. No pertinent family history.  Social History:  reports that she has never smoked. She has never used smokeless tobacco. She reports that she does not use drugs. No history on file for alcohol use.  ROS: All other review of systems were reviewed and are negative except what is noted above in HPI  Physical Exam: BP  125/76   Pulse 99   Temp 98.5 F (36.9 C)   Ht 5\' 6"  (1.676 m)   Wt 165 lb (74.8 kg)   LMP 10/18/2013   BMI 26.63 kg/m   Constitutional:  Alert and oriented, No acute distress. HEENT: [place] AT, moist mucus membranes.  Trachea midline, no masses. Cardiovascular: No clubbing, cyanosis, or edema. Respiratory: Normal respiratory effort, no increased work of breathing. GI: Abdomen is soft, nontender, nondistended, no abdominal masses GU: No CVA tenderness.  Lymph: No cervical or inguinal lymphadenopathy. Skin: No rashes, bruises or suspicious lesions. Neurologic: Grossly intact, no focal deficits, moving all 4 extremities. Psychiatric: Normal mood and affect.  Laboratory Data: No results found for: WBC, HGB, HCT, MCV, PLT  No results found for: CREATININE  No results found for: PSA  No results found for: TESTOSTERONE  No results found for: HGBA1C  Urinalysis    Component Value Date/Time   APPEARANCEUR Clear 03/20/2020 1426   GLUCOSEU Negative 03/20/2020 1426   BILIRUBINUR Negative 03/20/2020 1426   PROTEINUR Negative 03/20/2020 1426   NITRITE Negative 03/20/2020 1426   LEUKOCYTESUR Negative 03/20/2020 1426    Lab Results  Component Value Date   LABMICR See below: 03/20/2020   WBCUA 0-5 03/20/2020   LABEPIT >10 (A) 03/20/2020   BACTERIA Moderate (A) 03/20/2020    Pertinent Imaging: KUB 3/22: Images reviewed and discussed with the patient Results for orders placed  during the hospital encounter of 03/20/20  DG Abd 1 View  Narrative CLINICAL DATA:  Post lithotripsy  EXAM: ABDOMEN - 1 VIEW  COMPARISON:  03/06/2020  FINDINGS: Bilateral nephrolithiasis, the largest stone in the lower pole of the left kidney measuring 7 mm in the lower pole of the left kidney. No calcifications over the expected course of the ureters. Nonobstructive bowel gas pattern.  IMPRESSION: Bilateral nephrolithiasis.   Electronically Signed By: Charlett Nose M.D. On: 03/21/2020  02:27  No results found for this or any previous visit.  No results found for this or any previous visit.  No results found for this or any previous visit.  No results found for this or any previous visit.  No results found for this or any previous visit.  No results found for this or any previous visit.  Results for orders placed in visit on 02/29/20  CT RENAL STONE STUDY  Narrative CLINICAL DATA:  Left flank pain for 1 week. Hematuria.  EXAM: CT ABDOMEN AND PELVIS WITHOUT CONTRAST  TECHNIQUE: Multidetector CT imaging of the abdomen and pelvis was performed following the standard protocol without IV contrast.  COMPARISON:  Multiple exams, including 05/12/2017 CT scan  FINDINGS: Lower chest: Left anterior descending coronary artery atherosclerotic calcification.  Triangular subpleural density in the left lower lobe probably from atelectasis, measuring about 0.6 cm in thickness.  Hepatobiliary: 1.4 cm rim calcified gallstone in the gallbladder. Otherwise unremarkable.  Pancreas: Unremarkable  Spleen: Unremarkable  Adrenals/Urinary Tract: Two nonobstructive right renal calculi are present, the larger measuring 0.2 cm in diameter in the mid kidney.  Mild to moderate left hydronephrosis due to an obstructive left UPJ stone measuring 0.8 cm in long axis on image 42 of series 2. The ureter distal to this stone is of normal caliber. Two additional nonobstructive left renal calculi are present. No bladder calculi noted.  The adrenal glands appear normal.  Stomach/Bowel: The stomach is moderately distended with food material.  Scattered diverticula of the distal descending colon and proximal sigmoid colon. Borderline prominence of stool in the proximal colon.  Vascular/Lymphatic: Aortoiliac atherosclerotic vascular disease.  Reproductive: Uterus absent. Adnexa unremarkable.  Other: No supplemental non-categorized findings.  Musculoskeletal:  Unremarkable  IMPRESSION: 1. Mild to moderate left hydronephrosis due to an obstructive 0.8 cm left UPJ stone. 2. Bilateral nonobstructive nephrolithiasis. 3. Other imaging findings of potential clinical significance: Coronary atherosclerosis. Cholelithiasis. Scattered diverticula of the distal descending colon and proximal sigmoid colon. Borderline prominence of stool in the proximal colon. 4. Aortic atherosclerosis.  Aortic Atherosclerosis (ICD10-I70.0).   Electronically Signed By: Gaylyn Rong M.D. On: 02/29/2020 14:10   Assessment & Plan:    1. Ureteral calculi -RTC 6 months with renal US and KUB - Urinalysis, Routine w reflex microscopic  2. Weak urinary stream -Continue flomax 0.4mg     No follow-ups on file.  Wilkie Aye, MD  [facility] [place]

## 2020-05-01 NOTE — Progress Notes (Signed)
Urological Symptom Review  Patient is experiencing the following symptoms: Get up at night to urinate Weak stream Kidney stones  Review of Systems  Gastrointestinal (upper)  : Negative for upper GI symptoms  Gastrointestinal (lower) : Negative for lower GI symptoms  Constitutional : Negative for symptoms  Skin: Negative for skin symptoms  Eyes: Negative for eye symptoms  Ear/Nose/Throat : Negative for Ear/Nose/Throat symptoms  Hematologic/Lymphatic: Negative for Hematologic/Lymphatic symptoms  Cardiovascular : Leg swelling  Respiratory : Negative for respiratory symptoms  Endocrine: Negative for endocrine symptoms  Musculoskeletal: Negative for musculoskeletal symptoms  Neurological: Headaches  Psychologic: Negative for psychiatric symptoms

## 2020-11-02 ENCOUNTER — Ambulatory Visit: Payer: No Typology Code available for payment source | Admitting: Urology

## 2020-11-02 ENCOUNTER — Ambulatory Visit (HOSPITAL_COMMUNITY): Payer: No Typology Code available for payment source

## 2020-12-24 ENCOUNTER — Ambulatory Visit (HOSPITAL_COMMUNITY)
Admission: RE | Admit: 2020-12-24 | Discharge: 2020-12-24 | Disposition: A | Discharge status: Home / Self Care | Payer: No Typology Code available for payment source | Source: Ambulatory Visit | Attending: Urology | Admitting: Urology

## 2020-12-24 ENCOUNTER — Other Ambulatory Visit: Payer: Self-pay

## 2020-12-24 DIAGNOSIS — N201 Calculus of ureter: Secondary | ICD-10-CM | POA: Diagnosis present

## 2020-12-26 ENCOUNTER — Encounter: Payer: Self-pay | Admitting: Urology

## 2020-12-26 ENCOUNTER — Ambulatory Visit (INDEPENDENT_AMBULATORY_CARE_PROVIDER_SITE_OTHER): Payer: No Typology Code available for payment source | Admitting: Urology

## 2020-12-26 ENCOUNTER — Other Ambulatory Visit: Payer: Self-pay

## 2020-12-26 VITALS — BP 118/73 | HR 76 | Temp 98.3°F

## 2020-12-26 DIAGNOSIS — N200 Calculus of kidney: Secondary | ICD-10-CM | POA: Diagnosis not present

## 2020-12-26 DIAGNOSIS — R3912 Poor urinary stream: Secondary | ICD-10-CM

## 2020-12-26 NOTE — Patient Instructions (Signed)
Dietary Guidelines to Help Prevent Kidney Stones Kidney stones are deposits of minerals and salts that form inside your kidneys. Your risk of developing kidney stones may be greater depending on your diet, your lifestyle, the medicines you take, and whether you have certain medical conditions. Most people can lower their chances of developing kidney stones by following the instructions below. Your dietitian may give you more specific instructions depending on your overall health and the type of kidney stones you tend to develop. What are tips for following this plan? Reading food labels  Choose foods with "no salt added" or "low-salt" labels. Limit your salt (sodium) intake to less than 1,500 mg a day. Choose foods with calcium for each meal and snack. Try to eat about 300 mg of calcium at each meal. Foods that contain 200-500 mg of calcium a serving include: 8 oz (237 mL) of milk, calcium-fortifiednon-dairy milk, and calcium-fortifiedfruit juice. Calcium-fortified means that calcium has been added to these drinks. 8 oz (237 mL) of kefir, yogurt, and soy yogurt. 4 oz (114 g) of tofu. 1 oz (28 g) of cheese. 1 cup (150 g) of dried figs. 1 cup (91 g) of cooked broccoli. One 3 oz (85 g) can of sardines or mackerel. Most people need 1,000-1,500 mg of calcium a day. Talk to your dietitian about how much calcium is recommended for you. Shopping Buy plenty of fresh fruits and vegetables. Most people do not need to avoid fruits and vegetables, even if these foods contain nutrients that may contribute to kidney stones. When shopping for convenience foods, choose: Whole pieces of fruit. Pre-made salads with dressing on the side. Low-fat fruit and yogurt smoothies. Avoid buying frozen meals or prepared deli foods. These can be high in sodium. Look for foods with live cultures, such as yogurt and kefir. Choose high-fiber grains, such as whole-wheat breads, oat bran, and wheat cereals. Cooking Do not add  salt to food when cooking. Place a salt shaker on the table and allow each person to add his or her own salt to taste. Use vegetable protein, such as beans, textured vegetable protein (TVP), or tofu, instead of meat in pasta, casseroles, and soups. Meal planning Eat less salt, if told by your dietitian. To do this: Avoid eating processed or pre-made food. Avoid eating fast food. Eat less animal protein, including cheese, meat, poultry, or fish, if told by your dietitian. To do this: Limit the number of times you have meat, poultry, fish, or cheese each week. Eat a diet free of meat at least 2 days a week. Eat only one serving each day of meat, poultry, fish, or seafood. When you prepare animal protein, cut pieces into small portion sizes. For most meat and fish, one serving is about the size of the palm of your hand. Eat at least five servings of fresh fruits and vegetables each day. To do this: Keep fruits and vegetables on hand for snacks. Eat one piece of fruit or a handful of berries with breakfast. Have a salad and fruit at lunch. Have two kinds of vegetables at dinner. Limit foods that are high in a substance called oxalate. These include: Spinach (cooked), rhubarb, beets, sweet potatoes, and Swiss chard. Peanuts. Potato chips, french fries, and baked potatoes with skin on. Nuts and nut products. Chocolate. If you regularly take a diuretic medicine, make sure to eat at least 1 or 2 servings of fruits or vegetables that are high in potassium each day. These include: Avocado. Banana. Orange, prune,   carrot, or tomato juice. Baked potato. Cabbage. Beans and split peas. Lifestyle  Drink enough fluid to keep your urine pale yellow. This is the most important thing you can do. Spread your fluid intake throughout the day. If you drink alcohol: Limit how much you use to: 0-1 drink a day for women who are not pregnant. 0-2 drinks a day for men. Be aware of how much alcohol is in your  drink. In the [place] one drink equals one 12 oz bottle of beer (355 mL), one 5 oz glass of wine (148 mL), or one 1 oz glass of hard liquor (44 mL). Lose weight if told by your health care provider. Work with your dietitian to find an eating plan and weight loss strategies that work best for you. General information Talk to your health care provider and dietitian about taking daily supplements. You may be told the following depending on your health and the cause of your kidney stones: Not to take supplements with vitamin C. To take a calcium supplement. To take a daily probiotic supplement. To take other supplements such as magnesium, fish oil, or vitamin B6. Take over-the-counter and prescription medicines only as told by your health care provider. These include supplements. What foods should I limit? Limit your intake of the following foods, or eat them as told by your dietitian. Vegetables Spinach. Rhubarb. Beets. Canned vegetables. Pickles. Olives. Baked potatoes with skin. Grains Wheat bran. Baked goods. Salted crackers. Cereals high in sugar. Meats and other proteins Nuts. Nut butters. Large portions of meat, poultry, or fish. Salted, precooked, or cured meats, such as sausages, meat loaves, and hot dogs. Dairy Cheese. Beverages Regular soft drinks. Regular vegetable juice. Seasonings and condiments Seasoning blends with salt. Salad dressings. Soy sauce. Ketchup. Barbecue sauce. Other foods Canned soups. Canned pasta sauce. Casseroles. Pizza. Lasagna. Frozen meals. Potato chips. French fries. The items listed above may not be a complete list of foods and beverages you should limit. Contact a dietitian for more information. What foods should I avoid? Talk to your dietitian about specific foods you should avoid based on the type of kidney stones you have and your overall health. Fruits Grapefruit. The item listed above may not be a complete list of foods and beverages you should  avoid. Contact a dietitian for more information. Summary Kidney stones are deposits of minerals and salts that form inside your kidneys. You can lower your risk of kidney stones by making changes to your diet. The most important thing you can do is drink enough fluid. Drink enough fluid to keep your urine pale yellow. Talk to your dietitian about how much calcium you should have each day, and eat less salt and animal protein as told by your dietitian. This information is not intended to replace advice given to you by your health care provider. Make sure you discuss any questions you have with your health care provider. Document Revised: 01/20/2019 Document Reviewed: 01/20/2019 Elsevier Patient Education  2022 [employer]  

## 2020-12-26 NOTE — Progress Notes (Signed)
12/26/2020 3:24 PM   Cheryl Jenkins 1970/06/04 [id number]  Referring provider: No referring provider defined for this encounter.  Followup nephrolithiasis   HPI: Cheryl Jenkins is a 50yo here for followup for nephrolithiasis. No stone events since last visit. She denies any flank pain. She is drinking 80oz of water daily. Renal US 11/14 shows no calculi and no hydronephrosis. No other complaints today   PMH: Past Medical History:  Diagnosis Date   History of kidney stones     Surgical History: Past Surgical History:  Procedure Laterality Date   ABDOMINAL HYSTERECTOMY     EXTRACORPOREAL SHOCK WAVE LITHOTRIPSY Left 03/06/2020   Procedure: EXTRACORPOREAL SHOCK WAVE LITHOTRIPSY (ESWL);  Surgeon: Malen Gauze, MD;  Location: [facility];  Service: Urology;  Laterality: Left;   TONSILLECTOMY      Home Medications:  Allergies as of 12/26/2020   No Known Allergies      Medication List        Accurate as of December 26, 2020  3:24 PM. If you have any questions, ask your nurse or doctor.          STOP taking these medications    ondansetron 4 MG disintegrating tablet Commonly known as: Zofran ODT Stopped by: Wilkie Aye, MD   oxyCODONE-acetaminophen 5-325 MG tablet Commonly known as: Percocet Stopped by: Wilkie Aye, MD   tamsulosin 0.4 MG Caps capsule Commonly known as: FLOMAX Stopped by: Wilkie Aye, MD       TAKE these medications    aspirin EC 81 MG tablet Take 81 mg by mouth daily. Swallow whole.   multivitamin with minerals Tabs tablet Take 1 tablet by mouth daily.        Allergies: No Known Allergies  Family History: History reviewed. No pertinent family history.  Social History:  reports that she has never smoked. She has never used smokeless tobacco. She reports that she does not use drugs. No history on file for alcohol use.  ROS: All other review of systems were reviewed and are negative except what is noted above in  HPI  Physical Exam: BP 118/73   Pulse 76   Temp 98.3 F (36.8 C)   LMP 10/18/2013   Constitutional:  Alert and oriented, No acute distress. HEENT: [place] AT, moist mucus membranes.  Trachea midline, no masses. Cardiovascular: No clubbing, cyanosis, or edema. Respiratory: Normal respiratory effort, no increased work of breathing. GI: Abdomen is soft, nontender, nondistended, no abdominal masses GU: No CVA tenderness.  Lymph: No cervical or inguinal lymphadenopathy. Skin: No rashes, bruises or suspicious lesions. Neurologic: Grossly intact, no focal deficits, moving all 4 extremities. Psychiatric: Normal mood and affect.  Laboratory Data: No results found for: WBC, HGB, HCT, MCV, PLT  No results found for: CREATININE  No results found for: PSA  No results found for: TESTOSTERONE  No results found for: HGBA1C  Urinalysis    Component Value Date/Time   APPEARANCEUR Clear 05/01/2020 1442   GLUCOSEU Negative 05/01/2020 1442   BILIRUBINUR Negative 05/01/2020 1442   PROTEINUR Negative 05/01/2020 1442   NITRITE Negative 05/01/2020 1442   LEUKOCYTESUR 1+ (A) 05/01/2020 1442    Lab Results  Component Value Date   LABMICR See below: 05/01/2020   WBCUA 6-10 (A) 05/01/2020   LABEPIT 0-10 05/01/2020   MUCUS Present 05/01/2020   BACTERIA Many (A) 05/01/2020    Pertinent Imaging: Renal US 12/24/2020: Images reviewed and discussed with the patient Results for orders placed during the hospital encounter of 05/01/20  Abdomen  1 view (KUB)  Narrative CLINICAL DATA:  Nephrolithiasis.  EXAM: ABDOMEN - 1 VIEW  COMPARISON:  March 20, 2020.  FINDINGS: The bowel gas pattern is normal. Small right renal calculus is noted. Phleboliths are noted in the pelvis. Left renal calculus noted on prior exam is not well visualized currently.  IMPRESSION: Small right renal calculus is noted. Left renal calculus noted on prior exam is not well visualized currently.   Electronically  Signed By: Marijo Conception M.D. On: 05/02/2020 13:31  No results found for this or any previous visit.  No results found for this or any previous visit.  No results found for this or any previous visit.  Results for orders placed during the hospital encounter of 12/24/20  Ultrasound renal complete  Narrative CLINICAL DATA:  Nephrolithiasis.  EXAM: RENAL / URINARY TRACT ULTRASOUND COMPLETE  COMPARISON:  Abdomen 05/01/2020.  CT 02/29/2020.  FINDINGS: Right Kidney:  Renal measurements: 9.0 x 5.1 x 5.3 cm = volume: 132.0 mL. Echogenicity within normal limits. No mass or hydronephrosis visualized. No stones identified.  Left Kidney:  Renal measurements: 10.4 x 4.7 x 5.8 cm = volume: 148.3 mL. Echogenicity within normal limits. No mass. Mild residual left renal pelvic fullness. Interval resolution of previously identified prominent left-sided hydronephrosis. No stones identified.  Bladder:  Appears normal for degree of bladder distention. Bilateral ureteral jets visualized.  Other:  None.  IMPRESSION: 1. Mild residual left renal pelvic fullness. Interval resolution of previously identified prominent left-sided hydronephrosis. No renal stones identified.  2.  No bladder distention.  Bilateral ureteral jets visualized.   Electronically Signed By: Marcello Moores  Register M.D. On: 12/25/2020 10:28  No results found for this or any previous visit.  No results found for this or any previous visit.  Results for orders placed in visit on 02/29/20  CT RENAL STONE STUDY  Narrative CLINICAL DATA:  Left flank pain for 1 week. Hematuria.  EXAM: CT ABDOMEN AND PELVIS WITHOUT CONTRAST  TECHNIQUE: Multidetector CT imaging of the abdomen and pelvis was performed following the standard protocol without IV contrast.  COMPARISON:  Multiple exams, including 05/12/2017 CT scan  FINDINGS: Lower chest: Left anterior descending coronary artery atherosclerotic  calcification.  Triangular subpleural density in the left lower lobe probably from atelectasis, measuring about 0.6 cm in thickness.  Hepatobiliary: 1.4 cm rim calcified gallstone in the gallbladder. Otherwise unremarkable.  Pancreas: Unremarkable  Spleen: Unremarkable  Adrenals/Urinary Tract: Two nonobstructive right renal calculi are present, the larger measuring 0.2 cm in diameter in the mid kidney.  Mild to moderate left hydronephrosis due to an obstructive left UPJ stone measuring 0.8 cm in long axis on image 42 of series 2. The ureter distal to this stone is of normal caliber. Two additional nonobstructive left renal calculi are present. No bladder calculi noted.  The adrenal glands appear normal.  Stomach/Bowel: The stomach is moderately distended with food material.  Scattered diverticula of the distal descending colon and proximal sigmoid colon. Borderline prominence of stool in the proximal colon.  Vascular/Lymphatic: Aortoiliac atherosclerotic vascular disease.  Reproductive: Uterus absent. Adnexa unremarkable.  Other: No supplemental non-categorized findings.  Musculoskeletal: Unremarkable  IMPRESSION: 1. Mild to moderate left hydronephrosis due to an obstructive 0.8 cm left UPJ stone. 2. Bilateral nonobstructive nephrolithiasis. 3. Other imaging findings of potential clinical significance: Coronary atherosclerosis. Cholelithiasis. Scattered diverticula of the distal descending colon and proximal sigmoid colon. Borderline prominence of stool in the proximal colon. 4. Aortic atherosclerosis.  Aortic Atherosclerosis (ICD10-I70.0).   Electronically  Signed By: Van Clines M.D. On: 02/29/2020 14:10   Assessment & Plan:    1. Nephrolithiasis -RTC year with renal US  2. Weak urinary stream -resolved   No follow-ups on file.  Nicolette Bang, MD  [facility] [place]

## 2020-12-26 NOTE — Progress Notes (Signed)

## 2022-01-01 ENCOUNTER — Ambulatory Visit: Payer: No Typology Code available for payment source | Admitting: Physician Assistant

## 2022-01-08 IMAGING — CT CT RENAL STONE PROTOCOL
2 of 4 series · 16 of 46 positions shown, 18 images · non-contrast
Comparison: Multiple exams, including 05/12/2017 CT scan

CLINICAL DATA: Left flank pain for 1 week. Hematuria.

EXAM:
CT ABDOMEN AND PELVIS WITHOUT CONTRAST
TECHNIQUE: Multidetector CT imaging of the abdomen and pelvis was performed
following the standard protocol without IV contrast.

[Series 2: axial st · axial · 0.69mm/px · z∈[-602,-167]mm · 13 of 99 slices shown, 15 images]
[im 6/99  soft-tissue]
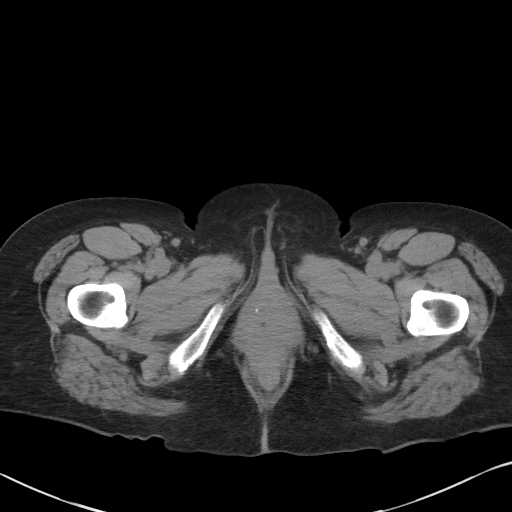
[im 6/99  bone]
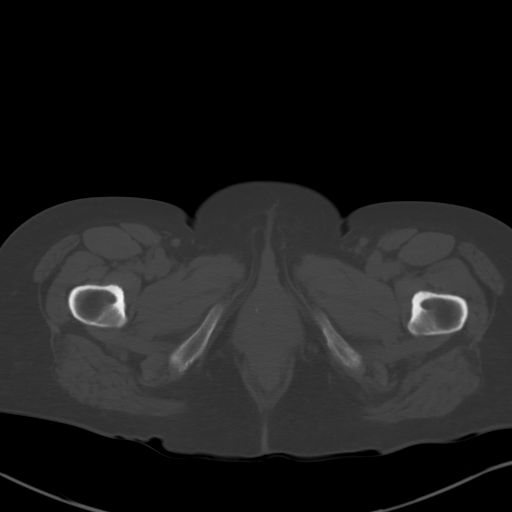
[im 12/99  soft-tissue]
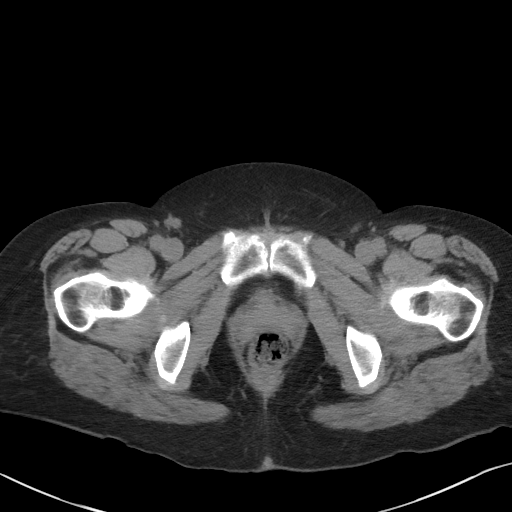
[im 24/99  soft-tissue]
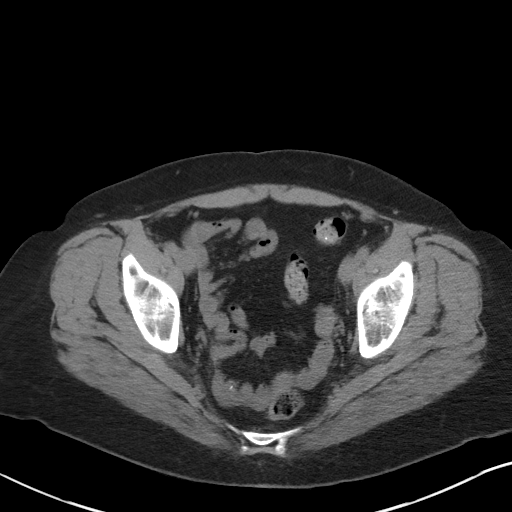
[im 29/99  soft-tissue]
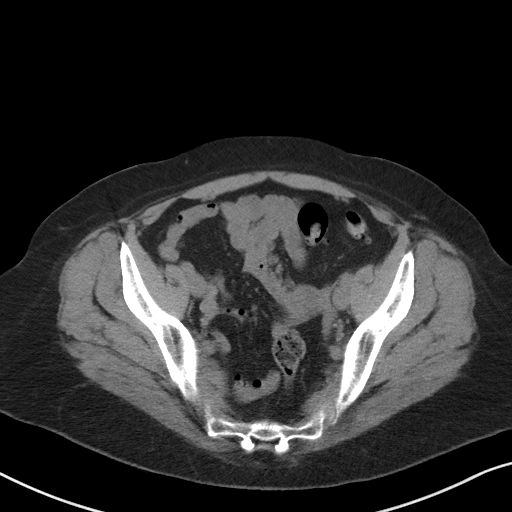
[im 35/99  soft-tissue]
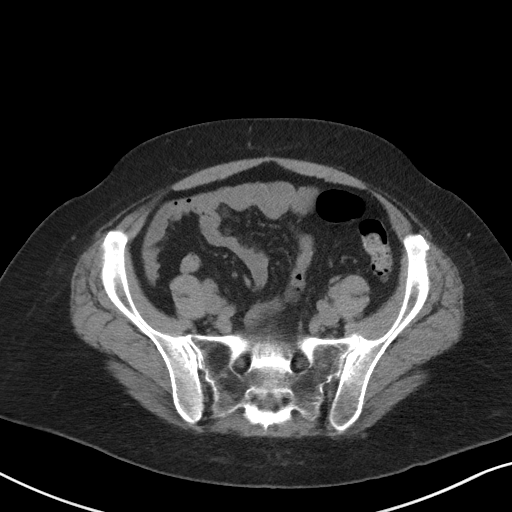
[im 41/99  soft-tissue]
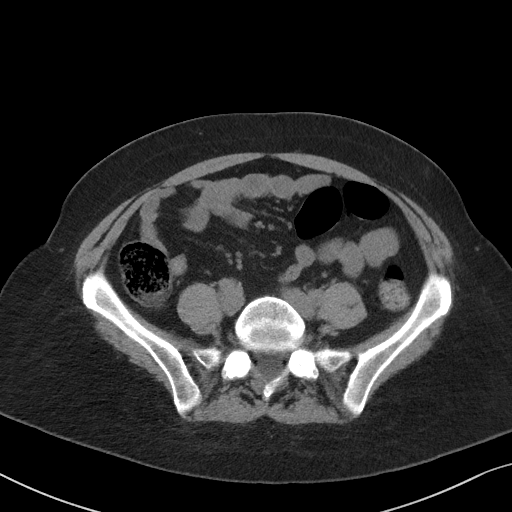
[im 52/99  soft-tissue]
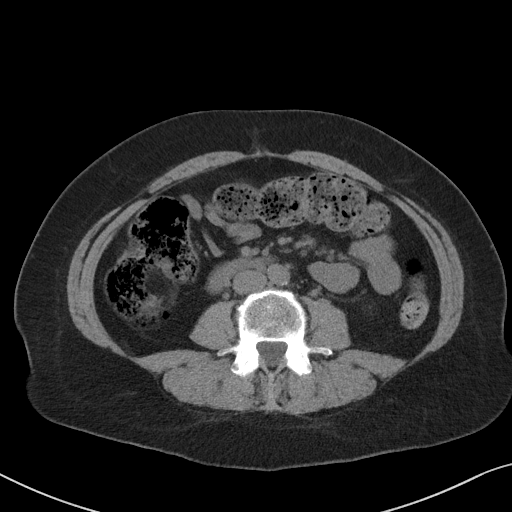
[im 58/99  soft-tissue]
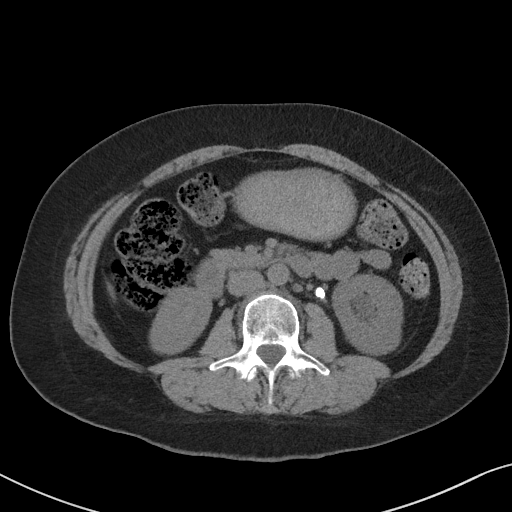
[im 64/99  soft-tissue]
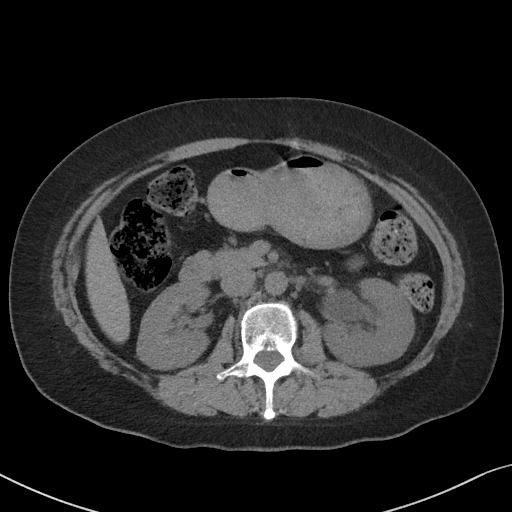
[im 64/99  bone]
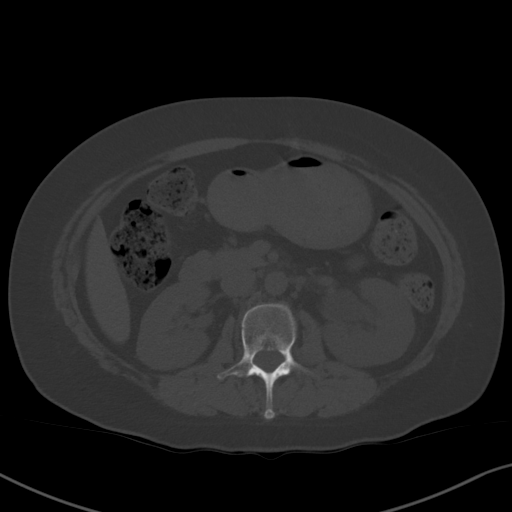
[im 70/99  soft-tissue]
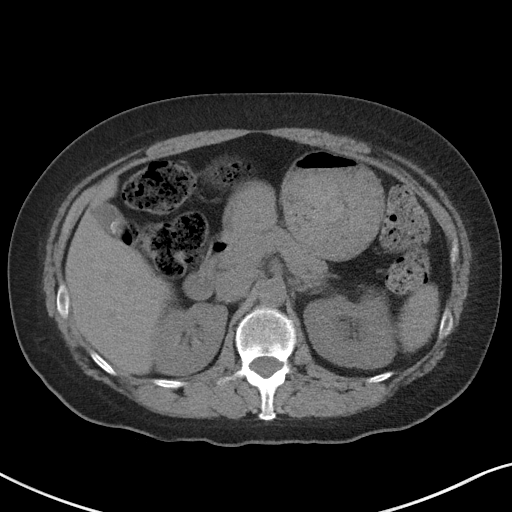
[im 75/99  soft-tissue]
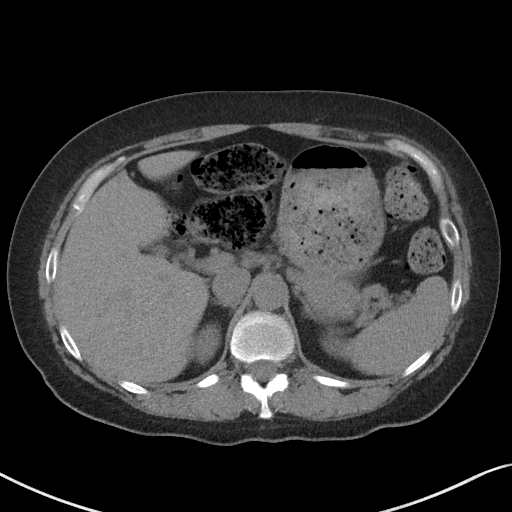
[im 87/99  soft-tissue]
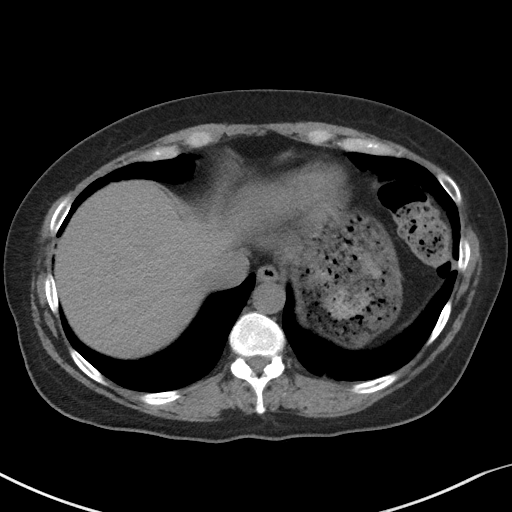
[im 93/99  soft-tissue]
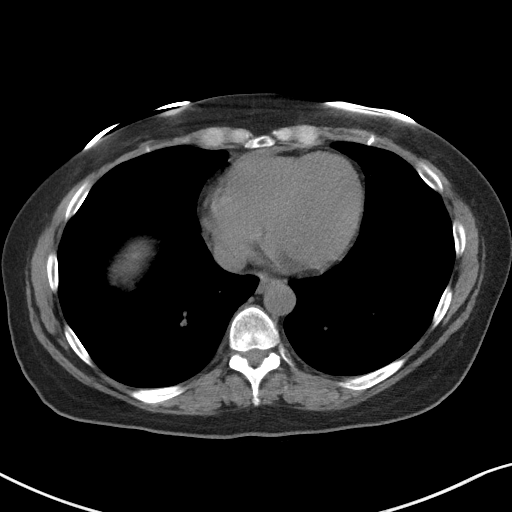

[Series 5: coronal st · coronal · 0.76mm/px · 3 of 101 slices shown]
[im 34/101  soft-tissue]
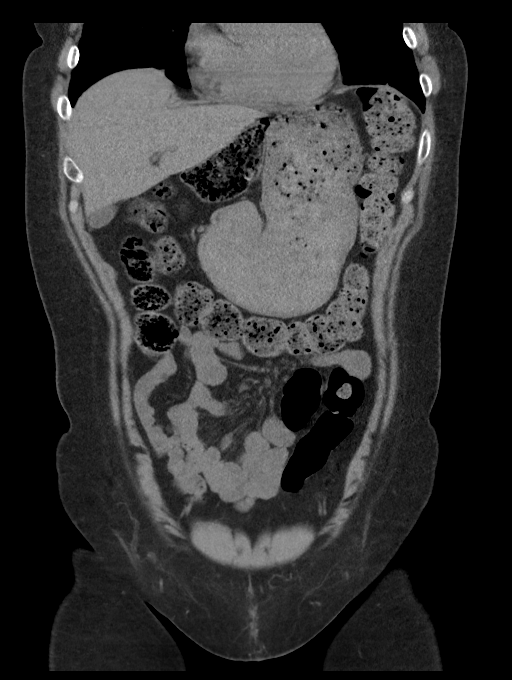
[im 45/101  soft-tissue]
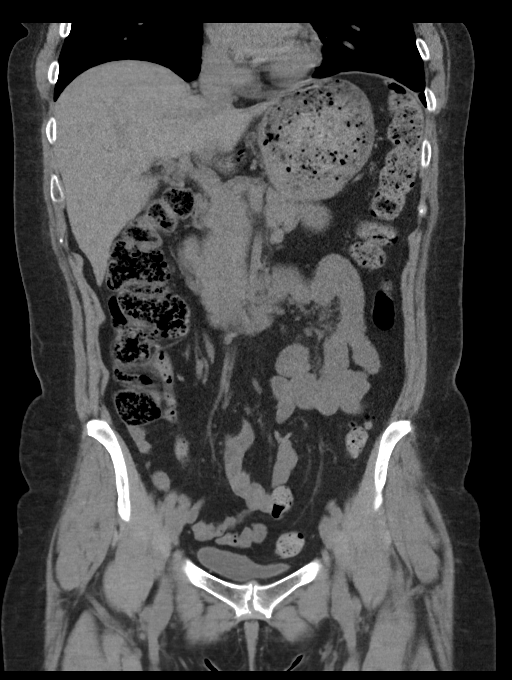
[im 56/101  soft-tissue]
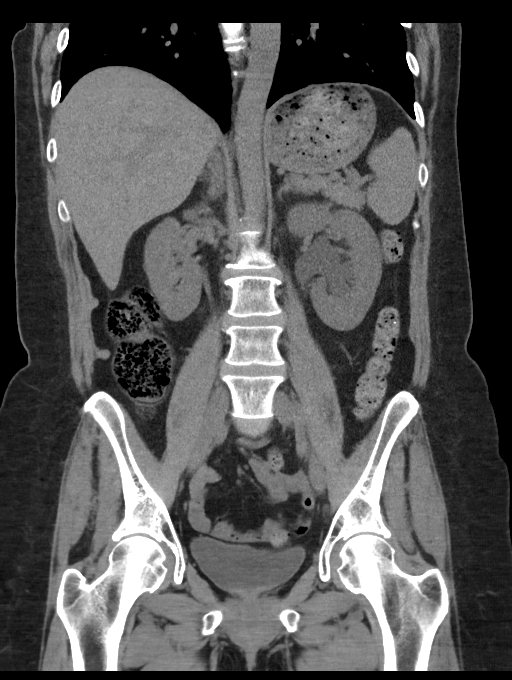

[16 of 46 positions shown; findings below may reference images not displayed]

FINDINGS: Lower chest: Left anterior descending coronary artery
atherosclerotic calcification.

Triangular subpleural density in the left lower lobe probably from
atelectasis, measuring about 0.6 cm in thickness.

Hepatobiliary: 1.4 cm rim calcified gallstone in the gallbladder.
Otherwise unremarkable.

Pancreas: Unremarkable

Spleen: Unremarkable

Adrenals/Urinary Tract: Two nonobstructive right renal calculi are
present, the larger measuring 0.2 cm in diameter in the mid kidney.

Mild to moderate left hydronephrosis due to an obstructive left UPJ
stone measuring 0.8 cm in long axis on image 42 of series 2. The
ureter distal to this stone is of normal caliber. Two additional
nonobstructive left renal calculi are present. No bladder calculi
noted.

The adrenal glands appear normal.

Stomach/Bowel: The stomach is moderately distended with food
material.

Scattered diverticula of the distal descending colon and proximal
sigmoid colon. Borderline prominence of stool in the proximal colon.

Vascular/Lymphatic: Aortoiliac atherosclerotic vascular disease.

Reproductive: Uterus absent. Adnexa unremarkable.

Other: No supplemental non-categorized findings.

Musculoskeletal: Unremarkable
IMPRESSION: 1. Mild to moderate left hydronephrosis due to an obstructive 0.8 cm
left UPJ stone.
2. Bilateral nonobstructive nephrolithiasis.
3. Other imaging findings of potential clinical significance:
Coronary atherosclerosis. Cholelithiasis. Scattered diverticula of
the distal descending colon and proximal sigmoid colon. Borderline
prominence of stool in the proximal colon.
4. Aortic atherosclerosis.

Aortic Atherosclerosis ([id number]).

## 2022-01-14 IMAGING — CR DG ABDOMEN 1V
1 series · 1 of 1 positions shown · non-contrast
Comparison: 04/20/2018 plain film.  02/29/2020 CT.

CLINICAL DATA: Pre lithotripsy this morning. Left flank pain with
known renal stone.

EXAM:
ABDOMEN - 1 VIEW

[t ap supine]
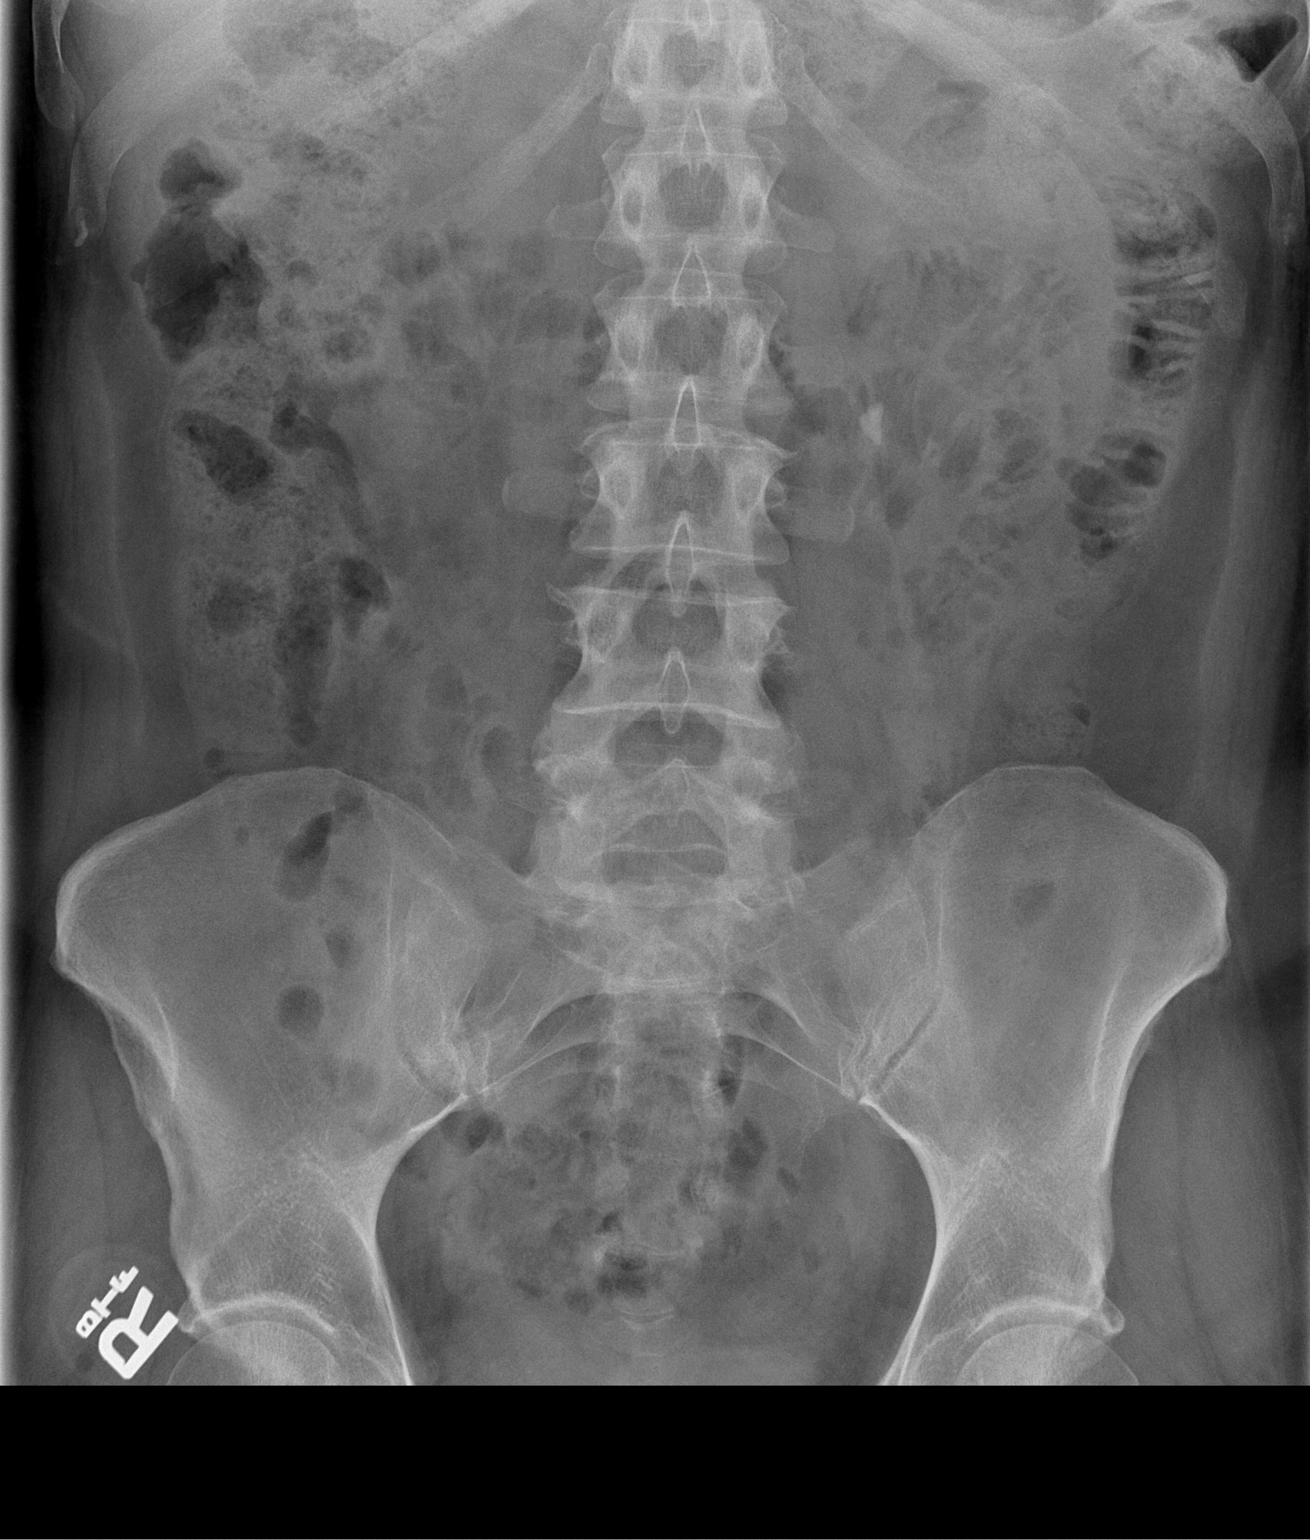

[1 of 1 positions shown; findings below may reference images not displayed]

FINDINGS: 1.0 cm left proximal ureteric stone projects between the left
transverse processes of L2 and L3.

The tiny renal calculi on the prior CT are not readily apparent,
given large volume overlying stool. A right pelvic phlebolith,
without distal ureteric stones. Non-obstructive bowel gas pattern.
IMPRESSION: Left proximal ureteric calculus, as before.

## 2022-01-28 IMAGING — DX DG ABDOMEN 1V
2 series · 2 of 2 positions shown · non-contrast
Comparison: 03/06/2020

CLINICAL DATA: Post lithotripsy

EXAM:
ABDOMEN - 1 VIEW

[abdomen kub (1 of 2)]
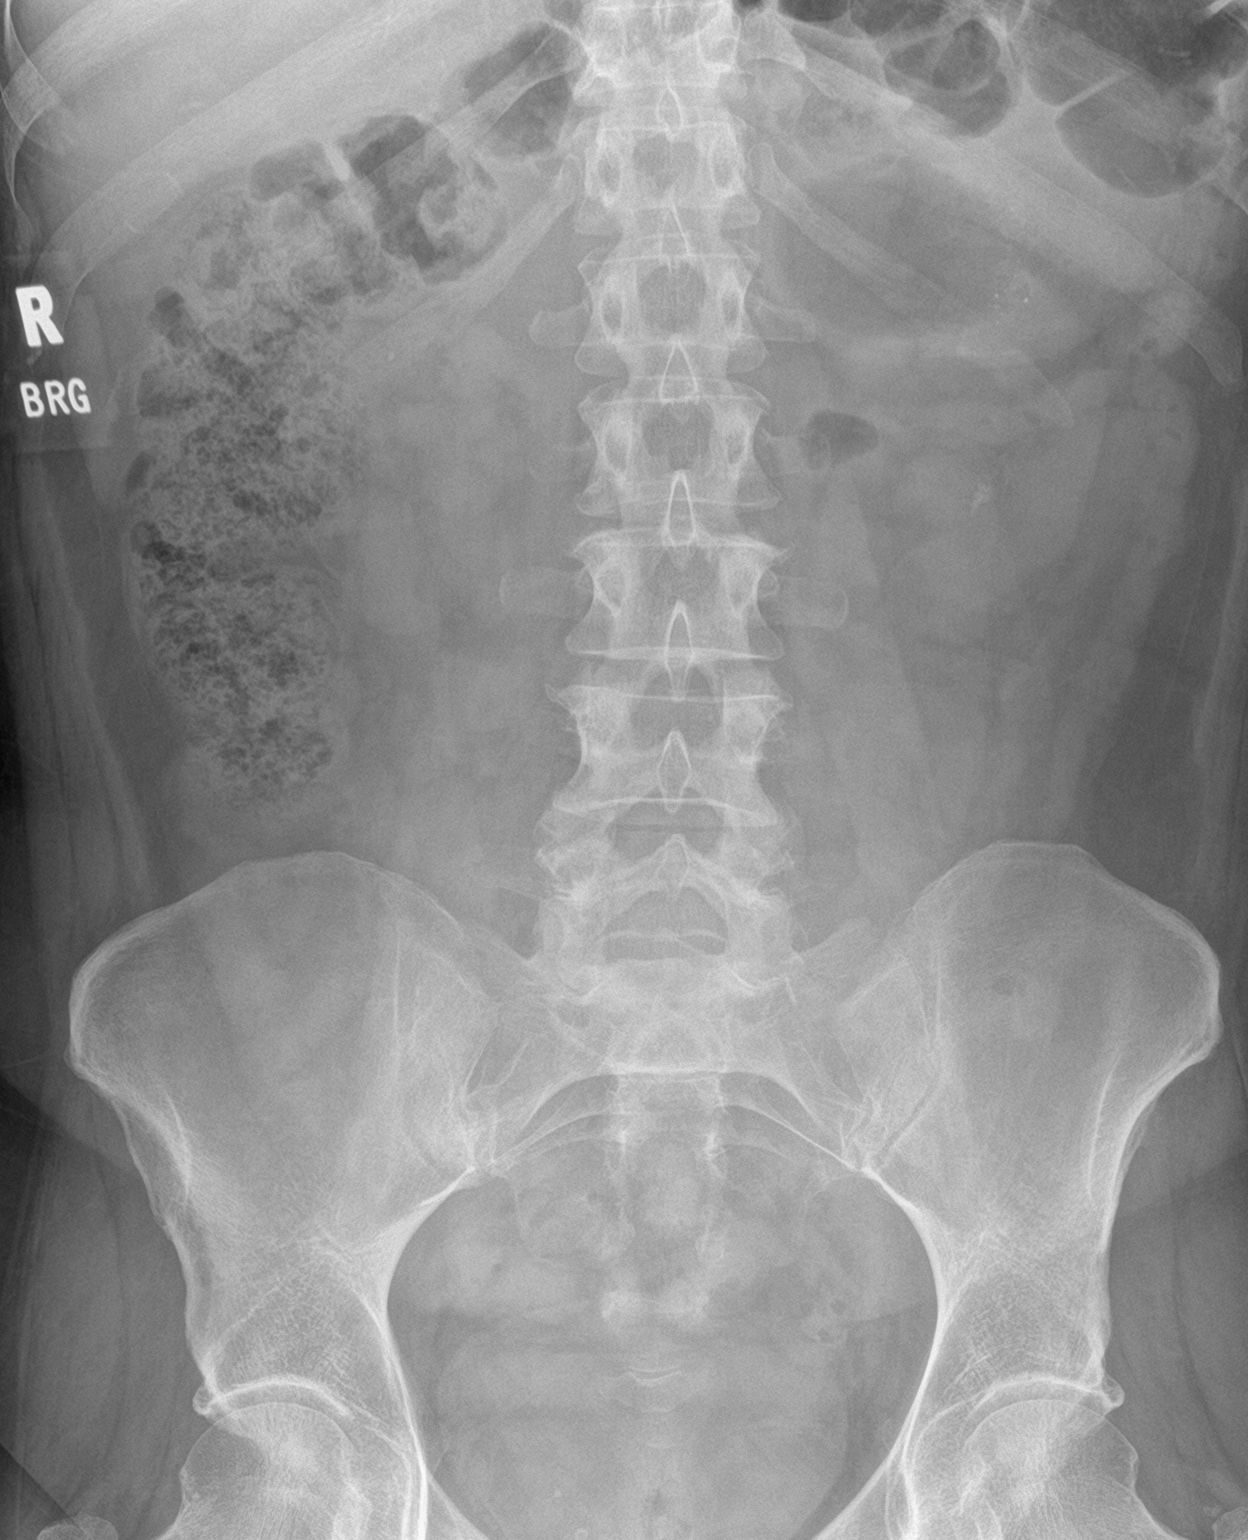

[abdomen kub (2 of 2)]
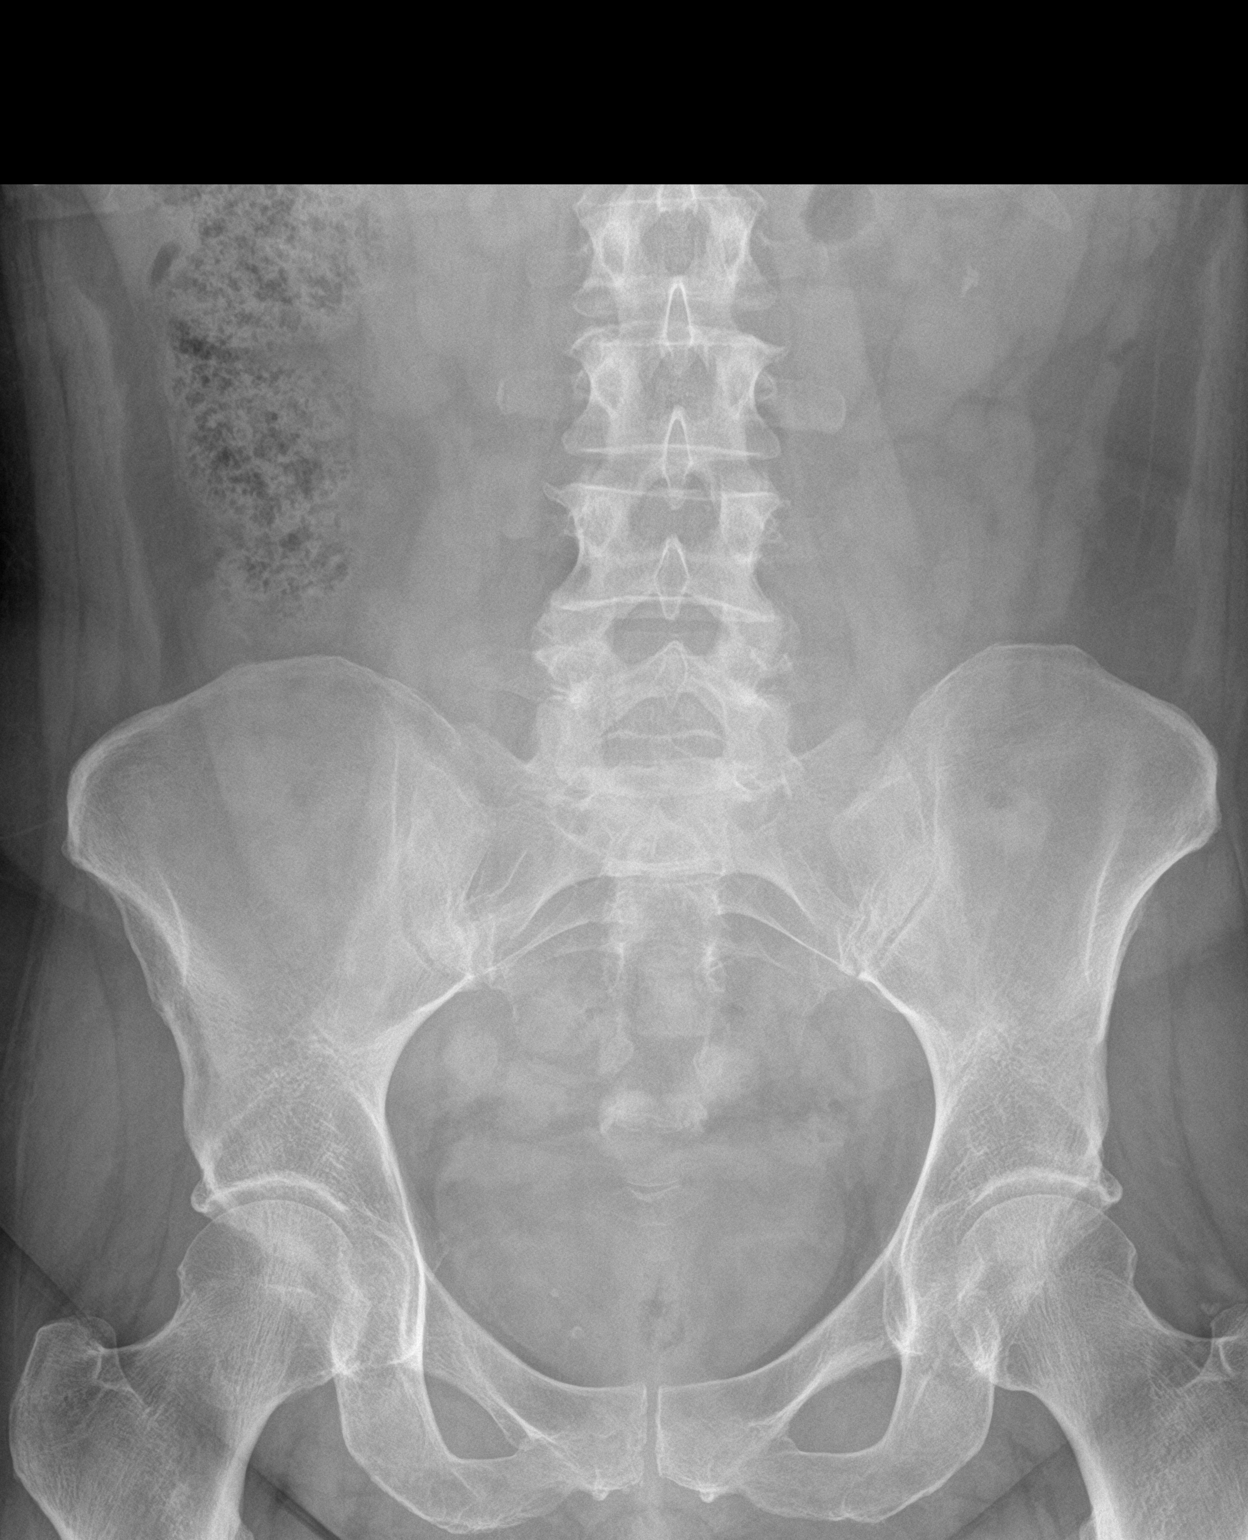

[2 of 2 positions shown; findings below may reference images not displayed]

FINDINGS: Bilateral nephrolithiasis, the largest stone in the lower pole of
the left kidney measuring 7 mm in the lower pole of the left kidney.
No calcifications over the expected course of the ureters.
Nonobstructive bowel gas pattern.
IMPRESSION: Bilateral nephrolithiasis.
